# Patient Record
Sex: Female | Born: 1960 | Race: Black or African American | Hispanic: No | Marital: Married | Smoking: Never smoker
Health system: Southern US, Community
[De-identification: ages and names within clinical notes are randomized; demographics above are authoritative.]

## PROBLEM LIST (undated history)

## (undated) DIAGNOSIS — E785 Hyperlipidemia, unspecified: Secondary | ICD-10-CM

## (undated) DIAGNOSIS — M549 Dorsalgia, unspecified: Secondary | ICD-10-CM

## (undated) DIAGNOSIS — R51 Headache: Secondary | ICD-10-CM

## (undated) DIAGNOSIS — J189 Pneumonia, unspecified organism: Secondary | ICD-10-CM

## (undated) DIAGNOSIS — K635 Polyp of colon: Secondary | ICD-10-CM

## (undated) DIAGNOSIS — K649 Unspecified hemorrhoids: Secondary | ICD-10-CM

## (undated) DIAGNOSIS — R0602 Shortness of breath: Secondary | ICD-10-CM

## (undated) DIAGNOSIS — H3530 Unspecified macular degeneration: Secondary | ICD-10-CM

## (undated) DIAGNOSIS — R079 Chest pain, unspecified: Secondary | ICD-10-CM

## (undated) DIAGNOSIS — M7989 Other specified soft tissue disorders: Secondary | ICD-10-CM

## (undated) DIAGNOSIS — R519 Headache, unspecified: Secondary | ICD-10-CM

## (undated) DIAGNOSIS — K6389 Other specified diseases of intestine: Secondary | ICD-10-CM

## (undated) DIAGNOSIS — M2550 Pain in unspecified joint: Secondary | ICD-10-CM

## (undated) DIAGNOSIS — M79672 Pain in left foot: Secondary | ICD-10-CM

## (undated) DIAGNOSIS — K219 Gastro-esophageal reflux disease without esophagitis: Secondary | ICD-10-CM

## (undated) DIAGNOSIS — N951 Menopausal and female climacteric states: Secondary | ICD-10-CM

## (undated) DIAGNOSIS — I10 Essential (primary) hypertension: Secondary | ICD-10-CM

## (undated) DIAGNOSIS — T7840XA Allergy, unspecified, initial encounter: Secondary | ICD-10-CM

## (undated) DIAGNOSIS — K589 Irritable bowel syndrome without diarrhea: Secondary | ICD-10-CM

## (undated) DIAGNOSIS — R768 Other specified abnormal immunological findings in serum: Secondary | ICD-10-CM

## (undated) DIAGNOSIS — D509 Iron deficiency anemia, unspecified: Secondary | ICD-10-CM

## (undated) DIAGNOSIS — G8929 Other chronic pain: Secondary | ICD-10-CM

## (undated) DIAGNOSIS — E669 Obesity, unspecified: Secondary | ICD-10-CM

## (undated) DIAGNOSIS — J45909 Unspecified asthma, uncomplicated: Secondary | ICD-10-CM

## (undated) DIAGNOSIS — R002 Palpitations: Secondary | ICD-10-CM

## (undated) HISTORY — DX: Pneumonia, unspecified organism: J18.9

## (undated) HISTORY — DX: Allergy, unspecified, initial encounter: T78.40XA

## (undated) HISTORY — PX: COLONOSCOPY: SHX174

## (undated) HISTORY — DX: Headache, unspecified: R51.9

## (undated) HISTORY — DX: Other chronic pain: G89.29

## (undated) HISTORY — DX: Chest pain, unspecified: R07.9

## (undated) HISTORY — DX: Headache: R51

## (undated) HISTORY — DX: Unspecified hemorrhoids: K64.9

## (undated) HISTORY — DX: Menopausal and female climacteric states: N95.1

## (undated) HISTORY — DX: Polyp of colon: K63.5

## (undated) HISTORY — DX: Pain in left foot: M79.672

## (undated) HISTORY — DX: Gastro-esophageal reflux disease without esophagitis: K21.9

## (undated) HISTORY — DX: Dorsalgia, unspecified: M54.9

## (undated) HISTORY — DX: Obesity, unspecified: E66.9

## (undated) HISTORY — DX: Irritable bowel syndrome, unspecified: K58.9

## (undated) HISTORY — DX: Shortness of breath: R06.02

## (undated) HISTORY — DX: Hyperlipidemia, unspecified: E78.5

## (undated) HISTORY — DX: Palpitations: R00.2

## (undated) HISTORY — DX: Iron deficiency anemia, unspecified: D50.9

## (undated) HISTORY — DX: Other specified abnormal immunological findings in serum: R76.8

## (undated) HISTORY — DX: Pain in unspecified joint: M25.50

## (undated) HISTORY — DX: Unspecified macular degeneration: H35.30

## (undated) HISTORY — DX: Unspecified asthma, uncomplicated: J45.909

## (undated) HISTORY — DX: Other specified soft tissue disorders: M79.89

---
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
## 1982-11-30 HISTORY — PX: APPENDECTOMY: SHX54

## 1989-11-30 HISTORY — PX: DILATION AND CURETTAGE OF UTERUS: SHX78

## 1989-11-30 HISTORY — PX: OTHER SURGICAL HISTORY: SHX169

## 2003-03-19 ENCOUNTER — Other Ambulatory Visit: Admission: RE | Admit: 2003-03-19 | Discharge: 2003-03-19 | Payer: Self-pay | Admitting: Obstetrics and Gynecology

## 2003-12-01 HISTORY — PX: ABDOMINAL HYSTERECTOMY: SHX81

## 2004-03-24 ENCOUNTER — Other Ambulatory Visit: Admission: RE | Admit: 2004-03-24 | Discharge: 2004-03-24 | Payer: Self-pay | Admitting: Obstetrics and Gynecology

## 2004-05-19 ENCOUNTER — Ambulatory Visit (HOSPITAL_COMMUNITY): Admission: RE | Admit: 2004-05-19 | Discharge: 2004-05-19 | Payer: Self-pay | Admitting: Family Medicine

## 2004-08-13 ENCOUNTER — Observation Stay (HOSPITAL_COMMUNITY): Admission: RE | Admit: 2004-08-13 | Discharge: 2004-08-14 | Payer: Self-pay | Admitting: Obstetrics and Gynecology

## 2004-08-13 ENCOUNTER — Encounter (INDEPENDENT_AMBULATORY_CARE_PROVIDER_SITE_OTHER): Payer: Self-pay | Admitting: *Deleted

## 2005-08-17 ENCOUNTER — Ambulatory Visit (HOSPITAL_COMMUNITY): Admission: RE | Admit: 2005-08-17 | Discharge: 2005-08-17 | Payer: Self-pay | Admitting: Obstetrics and Gynecology

## 2005-12-08 ENCOUNTER — Encounter: Admission: RE | Admit: 2005-12-08 | Discharge: 2005-12-08 | Payer: Self-pay | Admitting: Emergency Medicine

## 2007-03-23 ENCOUNTER — Emergency Department (HOSPITAL_COMMUNITY): Admission: EM | Admit: 2007-03-23 | Discharge: 2007-03-23 | Payer: Self-pay | Admitting: Emergency Medicine

## 2007-08-15 ENCOUNTER — Ambulatory Visit (HOSPITAL_COMMUNITY): Admission: RE | Admit: 2007-08-15 | Discharge: 2007-08-15 | Payer: Self-pay | Admitting: Obstetrics and Gynecology

## 2009-01-02 ENCOUNTER — Ambulatory Visit (HOSPITAL_COMMUNITY): Admission: RE | Admit: 2009-01-02 | Discharge: 2009-01-02 | Payer: Self-pay | Admitting: Obstetrics and Gynecology

## 2010-02-10 ENCOUNTER — Ambulatory Visit (HOSPITAL_COMMUNITY): Admission: RE | Admit: 2010-02-10 | Discharge: 2010-02-10 | Payer: Self-pay | Admitting: Obstetrics and Gynecology

## 2011-01-08 ENCOUNTER — Other Ambulatory Visit (HOSPITAL_COMMUNITY): Payer: Self-pay | Admitting: Obstetrics and Gynecology

## 2011-01-12 ENCOUNTER — Other Ambulatory Visit: Payer: Self-pay | Admitting: Obstetrics and Gynecology

## 2011-01-12 DIAGNOSIS — N644 Mastodynia: Secondary | ICD-10-CM

## 2011-01-15 ENCOUNTER — Ambulatory Visit
Admission: RE | Admit: 2011-01-15 | Discharge: 2011-01-15 | Disposition: A | Discharge status: Home / Self Care | Payer: BC Managed Care – PPO | Source: Ambulatory Visit | Attending: Obstetrics and Gynecology | Admitting: Obstetrics and Gynecology

## 2011-01-15 DIAGNOSIS — N644 Mastodynia: Secondary | ICD-10-CM

## 2011-02-11 ENCOUNTER — Other Ambulatory Visit: Payer: Self-pay | Admitting: Obstetrics and Gynecology

## 2011-02-11 DIAGNOSIS — Z1231 Encounter for screening mammogram for malignant neoplasm of breast: Secondary | ICD-10-CM

## 2011-03-02 ENCOUNTER — Ambulatory Visit
Admission: RE | Admit: 2011-03-02 | Discharge: 2011-03-02 | Disposition: A | Discharge status: Home / Self Care | Payer: BC Managed Care – PPO | Source: Ambulatory Visit | Attending: Obstetrics and Gynecology | Admitting: Obstetrics and Gynecology

## 2011-03-02 DIAGNOSIS — Z1231 Encounter for screening mammogram for malignant neoplasm of breast: Secondary | ICD-10-CM

## 2011-03-04 ENCOUNTER — Other Ambulatory Visit: Payer: Self-pay | Admitting: Obstetrics and Gynecology

## 2011-03-04 DIAGNOSIS — R928 Other abnormal and inconclusive findings on diagnostic imaging of breast: Secondary | ICD-10-CM

## 2011-03-09 ENCOUNTER — Ambulatory Visit
Admission: RE | Admit: 2011-03-09 | Discharge: 2011-03-09 | Disposition: A | Discharge status: Home / Self Care | Payer: BC Managed Care – PPO | Source: Ambulatory Visit | Attending: Obstetrics and Gynecology | Admitting: Obstetrics and Gynecology

## 2011-03-09 DIAGNOSIS — R928 Other abnormal and inconclusive findings on diagnostic imaging of breast: Secondary | ICD-10-CM

## 2011-04-17 NOTE — Op Note (Signed)
NAMEMIKEISHA, LEMONDS                           ACCOUNT NO.:  [id number]   MEDICAL RECORD NO.:  [id number]                   PATIENT TYPE:  INP   LOCATION:  9303                                 FACILITY:  [facility]   PHYSICIAN:  Maxie Better, M.D.            DATE OF BIRTH:  16-Oct-1961   DATE OF PROCEDURE:  08/13/2004  DATE OF DISCHARGE:                                 OPERATIVE REPORT   PREOPERATIVE DIAGNOSES:  1.  Menorrhagia.  2.  Uterine fibroids.   POSTOPERATIVE DIAGNOSE:  1.  Menorrhagia.  2.  Uterine fibroids.   PROCEDURE:  Laparoscopically-assisted vaginal hysterectomy.   SURGEON:  Maxie Better, M.D.   ASSISTANT:  Cordelia Pen A. Dickstein, M.D.   DESCRIPTION OF PROCEDURE:  Under adequate general anesthesia, the patient  was placed in the dorsal lithotomy position.  Examination under anesthesia  revealed a 12+ week size irregular uterus.  The patient was sterilely  prepped and draped in the usual fashion for a laparoscopic assisted vaginal  hysterectomy.  An indwelling Foley catheter was sterilely placed.  Bivalve  speculum was placed in the vagina.  A single-tooth tenaculum was placed in  the anterior lip of the cervix.  An acorn cannula was introduced into the  cervical os and attached to the tenaculum for manipulation of the uterus.  The bivalve speculum was then removed.  Attention was then turned to the  abdomen where 0.25% Marcaine was injected infraumbilically.  An  infraumbilical incision was then made.  The Veress needle was introduced  into the abdomen.  Opening pressure was 6 liters of carbon dioxide.  It was  insufflated and normal saline was used to test the placement of the Veress  needle.  The Veress needle was then removed.  A disposable 10 mm trocar was  introduced into the abdomen without incident.  A lighted video laparoscope  was introduced through that port.  A panoramic view was initially done.  The  uterus was noted to be large.  Perihepatic  adhesions were noted.  The liver  was otherwise normal.  The surgical abscess of the appendix was noted.  Additional ports were placed in the lower quadrants, right and left, after  illuminating the abdominal wall and a small incision was then made.  The 5  mm ports were then placed in the lower right lateral quadrants.  A probe was  then utilized to inspect the pelvis for capabilities of doing this type of  procedure in light of the enlarged uterus.  There was a subserosal posterior  fibroid noted in the cul-de-sac which had no adhesions.  The surgical  separation of both fallopian tubes was noted.  Both ovaries were noted to be  normal.  The utero-ovarian ligaments were shortened bilaterally.  There was  no evidence of endometriosis.  Using the tripolar, the left round ligament  and left utero-ovarian ligaments were then cauterized and multiple times cut  until  the level of the full reflection of the bladder was reached.  The  Nezhat was then utilized to insufflate with water. the peritoneum of the  bladder and the bladder was then opened transversely.  The right utero-  ovarian ligament was then again also cauterized, cut until the bladder  reflection was also reached.  The large size of the uterus limited the  ability to mobilize as well but taking down the right side was sequentially  performed.  The bladder reflection was then again insufflated with water and  then opened transversely completely.  The bladder was then pushed downward  using the scissors.  The ureters were then subsequently identified  bilaterally and peristalsing well.  Cauterization of any blood vessels was  then performed after the upper abdomen was thought to be sufficiently  dissected.  The ports were left in place.  The instruments in the ports were  removed.  The abdomen was deflated.  Attention was then turned to the  vagina.  A weighted speculum was then placed.  The cervix was parous, large  but high in the  vault.  Sims retractor was then utilized and the cervix was  subsequently grasped with Christella Hartigan tenaculums anteriorly and posteriorly.  Circumferentially, dilute solution was injected at the cervical vaginal  junction.  A circumferential incision was made at that junction which led  the posterior cul-de-sac to be opened and the uterosacral ligaments were  then bilaterally clamped, cut and suture ligated with 0 Vicryl suture.  Bleeding from the posterior cuff was noted and the cuff was then isolated  posteriorly and reached with 0 Vicryl running locked stitch.  The vaginal  mucosa was then continued to be undermined from the cervical attachment  circumferentially and _________ anteriorly.  The lower portion of the  cardinal ligaments was then again clamped, cut and suture ligated with 0  Vicryl suture.  The anterior cul-de-sac was then subsequently opened after  dissection.  The decision was then made to do the uterine vessels.  On the  left, the uterine vessels were noted and cauterized using the LigaSure and  subsequently cut.  Despite cauterization, backbleeding was noted to be  prominent from the portion on the cervix.  A decision was then made to  utilize the Haney clamp.  I clamped the left uterine vessels, cut and suture  ligated it with 0 Vicryl suture.  Additional back bleeders were then clamped  with the straight Haney clamp.  Attention was then turned to the  contralateral side where again using the LigaSure serial clamp and  cauterization was then performed which was reached the right uterine vessels  which were then cauterized, cut.  On the left side, the additional  attachment.  All attachments on the left were then accomplished.  The  decision was then made to bivalve the uterus and this was done in the  midline of the cervix and fibroids were removed sequentially.  A portion of the uterus was then morcellated to facilitate the debulking and bringing  down of the uterus.   Subsequently, the uterus was delivered after several  fibroids and portions of it had been removed.  The remaining attachment on  the right was then cauterized with the LigaSure and cut.  The uterine  vessels on the right were then subsequently additionally clamped, and suture  ligated with 0 Vicryl suture.  The right uterosacral ligament had some  bleeding.  This was reclamped and resuture ligated with Haney suturing of 0  Vicryl.  Bleeding on the posterior reefed cuff was then additionally  hemostased with 0 Vicryl figure-of-eight sutures.  Hemostasis then was  subsequently noted.  The anterior peritoneum was identified and the vaginal  cuff was then closed vertically with interrupted 0 Vicryl figure-of-eight  sutures.  The uterosacral ligaments were then tied in the midline and the  vaginal cuff completely closed with 0 Vicryl interrupted sutures.  Attention  was then turned to the abdomen where the abdomen was reinsufflated.  The  lighted video laparoscope was then introduced.  The pelvis was inspected.  Small bleeders were cauterized.  The cavity was then suctioned.  The abdomen  was partially deflated and reinspection of the pelvis showed good hemostasis  at which point the lower portion was then removed under direct  visualization.  Good hemostasis was noted at the port sites.  The  infraumbilical site was then removed under direct visualization.  The  abdomen was further deflated.  The fascia was then identified in the  infraumbilical area and closed with 0 Vicryl figure-of-eight sutures and the  skin incisions all were approximated with 4-0 Vicryl subcuticular stitch.  Specimen was the morcellated uterus and cervix and fibroids sent to  pathology  Estimated blood loss is about 700 mL.  Intraoperative fluid was  2200 mL of crystalloid . Urine output was 500 mL of clear yellow urine.  Sponge and instrument counts x2 were correct.  Complications were none.  The  patient tolerated the  procedure well and was transferred to recovery in  stable condition.                                               Maxie Better, M.D.    [place]/MEDQ  D:  08/13/2004  T:  08/13/2004  Job:  [id number]

## 2011-04-17 NOTE — H&P (Signed)
[name], [name]                           ACCOUNT NO.:  [id number]   MEDICAL RECORD NO.:  [id number]                   PATIENT TYPE:  INP   LOCATION:  NA                                   [facility]ACILITY:  [facility]   PHYSICIAN:  Maxie Better, M.D.            DATE OF BIRTH:  26-Oct-1961   DATE OF ADMISSION:  08/13/2004  DATE OF DISCHARGE:                                HISTORY & PHYSICAL   CHIEF COMPLAINT:  Fibroid uterus, menorrhagia.   HISTORY OF PRESENT ILLNESS:  [age]-year-old gravida 4, para 2-0-2-2,  married black female, history of tubal ligation, currently on Lupron Depot  therapy, now being admitted for laparoscopic assisted vaginal hysterectomy  secondary to symptomatic uterine fibroids.  The patient has had heavy  menses; her cycles are 28 days with the first 2 days the heaviest flow.  She  has also had pain with intercourse and pain in her back as a result of the  fibroids.  The patient complained of urinary frequency and inability to  empty her bladder on occasions.  She was seen by the urologist and  evaluation did not reveal any stress urinary incontinence and medications  which she did not take.  Her evaluation for her gynecological complaints  included an ultrasound done on Apr 21, 2004 that showed multiple uterine  fibroids, uterus measuring 11.5 x 7.7 and 8.7; a submucosal fibroid could  not be ruled out at the time.  Both ovaries were seen and normal.  The  patient also had an endometrial biopsy done on May 05, 2004 that showed  benign secretory endometrium.  Her CBC on Apr 23, 2003 showed a hemoglobin  of 10.1, hematocrit of 31.5; the patient was placed on iron supplementation  and elected to do a Lupron Depot therapy to facilitate shrinkage of her  uterine size and increase her hemoglobin.  The patient declined any other  treatment options for her fibroids.   ALLERGIES:  Allergies to AMOXICILLIN.   MEDICATIONS:  Atenolol, __________, iron supplementation.   PAST MEDICAL HISTORY:  1.  Chronic hypertension.  2.  Iron deficiency anemia.  3.  Exogenous obesity.   SURGICAL HISTORY:  1.  Appendectomy.  2.  Bilateral tubal ligation.  3.  D&C.  4.  Cryosurgery.   OBSTETRICAL HISTORY:  D&E for a TAB and SAB, 2 vaginal deliveries.   FAMILY HISTORY:  Mother -- hypertension and diabetes.  No genital, ovarian  or colon cancer.   SOCIAL HISTORY:  Married, nonsmoker, 2 children, self-employed.   REVIEW OF SYSTEMS:  Review of systems as per history of present illness,  otherwise, negative.   PHYSICAL EXAM:  GENERAL:  Well-developed, well-nourished black female in no  acute distress.  VITAL SIGNS:  Blood pressure 128/86.  Weight 237 pounds, height 5 feet 5-3/4  inches.  SKIN:  Skin shows no lesions.  HEENT:  Anicteric sclerae.  Pink conjunctivae.  Oropharynx negative.  Prominent tonsils.  NECK:  Neck was supple.  Thyroid palpable.  NODES:  No supraclavicular, axillary or inguinal nodes palpable.  LUNGS:  Lungs were clear to auscultation.  HEART:  Heart was regular rate and rhythm without murmur.  BREASTS:  Breasts were soft, nontender, pedunculated.  No palpable mass.  Upward nipples without discharge.  BACK:  No CVA tenderness.  ABDOMEN:  Abdomen obese, soft with infraumbilical scar and a right lower  quadrant scar.  PELVIC:  Vulva shows no lesions.  Vagina had some white discharge.  Cervix  was parous.  Uterus was about 10 weeks' size.  No adnexal masses could be  appreciated.  RECTAL:  Exam deferred.  EXTREMITIES:  No edema.   IMPRESSION:  1.  Menorrhagia.  2.  Fibroid uterus.  3.  Chronic hypertension.   PLAN:  Admission, routine admission labs, laparoscopic assisted vaginal  hysterectomy with preservation of ovarian function, antibiotic prophylaxis,  antiembolic stockings.  Risks of the procedure were reviewed with the  patient including, but not limited to, infection, bleeding, inability to  complete the procedure vaginally,  injury to surrounding organ structures  such as the bladder, bowel or ureter, thermal injury, fistula formation,  possible need for surgery in the future secondary to ovarian cyst or ovarian  cancer/ovarian disease, bowel obstruction, internal scar tissue which can  lead to pelvic pain in the future.  Risks of blood transfusion were  discussed including HIV transmission, hepatitis transmission and acute  reaction to the blood products.  Postop care and criteria for discharge were  reviewed, continuation of antihypertensive medication postoperatively is  planned.  All questions answered.                                               Maxie Better, M.D.    [place]/MEDQ  D:  08/12/2004  T:  08/12/2004  Job:  [id number]

## 2011-12-06 ENCOUNTER — Emergency Department (HOSPITAL_COMMUNITY)
Admission: EM | Admit: 2011-12-06 | Discharge: 2011-12-06 | Disposition: A | Discharge status: Home / Self Care | Payer: BC Managed Care – PPO | Attending: Emergency Medicine | Admitting: Emergency Medicine

## 2011-12-06 ENCOUNTER — Encounter (HOSPITAL_COMMUNITY): Payer: Self-pay | Admitting: Nurse Practitioner

## 2011-12-06 DIAGNOSIS — I10 Essential (primary) hypertension: Secondary | ICD-10-CM | POA: Insufficient documentation

## 2011-12-06 DIAGNOSIS — L509 Urticaria, unspecified: Secondary | ICD-10-CM | POA: Insufficient documentation

## 2011-12-06 DIAGNOSIS — L298 Other pruritus: Secondary | ICD-10-CM | POA: Insufficient documentation

## 2011-12-06 DIAGNOSIS — L2989 Other pruritus: Secondary | ICD-10-CM | POA: Insufficient documentation

## 2011-12-06 DIAGNOSIS — Z79899 Other long term (current) drug therapy: Secondary | ICD-10-CM | POA: Insufficient documentation

## 2011-12-06 DIAGNOSIS — J351 Hypertrophy of tonsils: Secondary | ICD-10-CM | POA: Insufficient documentation

## 2011-12-06 DIAGNOSIS — R609 Edema, unspecified: Secondary | ICD-10-CM | POA: Insufficient documentation

## 2011-12-06 HISTORY — DX: Essential (primary) hypertension: I10

## 2011-12-06 MED ORDER — PREDNISONE 20 MG PO TABS: 10.0000 mg | ORAL_TABLET | Freq: Once | ORAL | Status: DC

## 2011-12-06 MED ORDER — PREDNISONE 20 MG PO TABS
60.0000 mg | ORAL_TABLET | Freq: Once | ORAL | Status: AC
  Administered 2011-12-06: 60 mg via ORAL
  Filled 2011-12-06: qty 3

## 2011-12-06 MED ORDER — FAMOTIDINE 20 MG PO TABS
20.0000 mg | ORAL_TABLET | Freq: Once | ORAL | Status: AC
  Administered 2011-12-06: 20 mg via ORAL
  Filled 2011-12-06: qty 1

## 2011-12-06 MED ORDER — FAMOTIDINE 20 MG PO TABS: 20.0000 mg | ORAL_TABLET | Freq: Two times a day (BID) | ORAL | Status: DC

## 2011-12-06 MED ORDER — LORATADINE 10 MG PO TABS: 10.0000 mg | ORAL_TABLET | Freq: Every day | ORAL | Status: DC

## 2011-12-06 MED ORDER — PREDNISONE 50 MG PO TABS: 50.0000 mg | ORAL_TABLET | Freq: Every day | ORAL | Status: DC

## 2011-12-06 MED ORDER — DIPHENHYDRAMINE HCL 25 MG PO CAPS
25.0000 mg | ORAL_CAPSULE | Freq: Once | ORAL | Status: AC
  Administered 2011-12-06: 25 mg via ORAL
  Filled 2011-12-06: qty 1

## 2011-12-06 NOTE — ED Provider Notes (Signed)
History     CSN: [id number]  Arrival date & time 12/06/11  4010   First MD Initiated Contact with Patient 12/06/11 1014      Chief Complaint  Patient presents with  . Urticaria    (Consider location/radiation/quality/duration/timing/severity/associated sxs/prior treatment) Patient is a 51 y.o. female presenting with urticaria. The history is provided by the patient.  Urticaria  She started having hives 3 days ago. Hives they're generalized and itchy. She is not aware of anything that she may have come in contact to precipitate the hives, but the Benadryl does give her temporary relief from the hives. She denies any difficulty breathing or swallowing. She has had hives before. One episode of hives was related to fish oil capsules, but other episodes were not clearly identified with any precipitating cause. Symptoms are moderate.  Past Medical History  Diagnosis Date  . Hypertension     History reviewed. No pertinent past surgical history.  History reviewed. No pertinent family history.  History  Substance Use Topics  . Smoking status: Never Smoker   . Smokeless tobacco: Not on file  . Alcohol Use: No    OB History    Grav Para Term Preterm Abortions TAB SAB Ect Mult Living                  Review of Systems  All other systems reviewed and are negative.    Allergies  Amoxicillin and Shellfish allergy  Home Medications   Current Outpatient Rx  Name Route Sig Dispense Refill  . ATENOLOL 50 MG PO TABS Oral Take 50 mg by mouth daily.      Marland Kitchen DIPHENHYDRAMINE HCL 25 MG PO TABS Oral Take 25 mg by mouth every 6 (six) hours as needed. For itching       BP 135/69  Pulse 62  Temp(Src) 98 F (36.7 C) (Oral)  Resp 18  SpO2 99%  Physical Exam  Nursing note and vitals reviewed.  51 year old female is resting comfortably and in no acute distress. Vital signs are normal. Oxygen saturation is 99% which is normal. Head is normocephalic and atraumatic. PERRLA, EOMI.  Oropharynx is mild to moderate erythema with slight tonsillar hypertrophy. No exudate is seen. She has no difficulty with her secretions and phonation is normal. Neck is supple without adenopathy. Lungs are clear without rales, wheezes, rhonchi. Back is nontender. Heart has regular rate rhythm without murmur. There is no chest wall tenderness. Abdomen is soft, flat, nontender without masses or hepatosplenomegaly. Extremities have no cyanosis. There is a 1+ edema. Range of motion is normal. Skin has diffuse urticarial lesions which are more prominent on her legs than the trunk or arms. Some lesions are present on her face as well. Neurologic: Mental status is normal, cranial nerves are intact, there are no focal motor or sensory deficits. Psychiatric: No abnormalities of mood or affect.  ED Course  Procedures (including critical care time)   Labs Reviewed  RAPID STREP SCREEN   No results found.  Results for orders placed during the hospital encounter of 12/06/11  RAPID STREP SCREEN      Component Value Range   Streptococcus, Group A Screen (Direct) NEGATIVE  NEGATIVE    No results found.   No diagnosis found.  She is given initial doses of Pepcid, prednisone, and Benadryl.  Impression: Urticaria. She will be sent home with prescription for prednisone 50 mg for 5 days and instructed to take either Claritin or Zyrtec once a day and either Pepcid  or Zantac twice a day.  MDM  Urticaria which is idiopathic. Because of the throat does appear to be inflamed, beta strep screen will be obtained to be sure this is not a strep associated rash.        Dione Booze, MD 12/06/11 1114

## 2011-12-06 NOTE — ED Notes (Signed)
Pt reports hives to extremities and face onset Thursday, taking benadryl with no relief. Pt denies SOB or pain. States they are itchy. No new products or meds.

## 2012-04-11 ENCOUNTER — Other Ambulatory Visit (HOSPITAL_COMMUNITY): Payer: Self-pay | Admitting: Obstetrics and Gynecology

## 2012-04-11 DIAGNOSIS — Z1231 Encounter for screening mammogram for malignant neoplasm of breast: Secondary | ICD-10-CM

## 2012-05-09 ENCOUNTER — Ambulatory Visit (HOSPITAL_COMMUNITY)
Admission: RE | Admit: 2012-05-09 | Discharge: 2012-05-09 | Disposition: A | Discharge status: Home / Self Care | Payer: BC Managed Care – PPO | Source: Ambulatory Visit | Attending: Obstetrics and Gynecology | Admitting: Obstetrics and Gynecology

## 2012-05-09 DIAGNOSIS — Z1231 Encounter for screening mammogram for malignant neoplasm of breast: Secondary | ICD-10-CM | POA: Insufficient documentation

## 2013-09-25 ENCOUNTER — Other Ambulatory Visit (HOSPITAL_COMMUNITY): Payer: Self-pay | Admitting: Obstetrics and Gynecology

## 2013-09-25 DIAGNOSIS — Z1231 Encounter for screening mammogram for malignant neoplasm of breast: Secondary | ICD-10-CM

## 2013-10-09 ENCOUNTER — Ambulatory Visit (HOSPITAL_COMMUNITY): Payer: BC Managed Care – PPO

## 2013-10-09 ENCOUNTER — Ambulatory Visit (HOSPITAL_COMMUNITY)
Admission: RE | Admit: 2013-10-09 | Discharge: 2013-10-09 | Disposition: A | Discharge status: Home / Self Care | Payer: BC Managed Care – PPO | Source: Ambulatory Visit | Attending: Obstetrics and Gynecology | Admitting: Obstetrics and Gynecology

## 2013-10-09 DIAGNOSIS — Z1231 Encounter for screening mammogram for malignant neoplasm of breast: Secondary | ICD-10-CM

## 2014-01-24 ENCOUNTER — Emergency Department (HOSPITAL_COMMUNITY)
Admission: EM | Admit: 2014-01-24 | Discharge: 2014-01-24 | Disposition: A | Discharge status: Home / Self Care | Payer: BC Managed Care – PPO | Attending: Emergency Medicine | Admitting: Emergency Medicine

## 2014-01-24 ENCOUNTER — Encounter (HOSPITAL_COMMUNITY): Payer: Self-pay | Admitting: Emergency Medicine

## 2014-01-24 DIAGNOSIS — IMO0002 Reserved for concepts with insufficient information to code with codable children: Secondary | ICD-10-CM | POA: Insufficient documentation

## 2014-01-24 DIAGNOSIS — Z79899 Other long term (current) drug therapy: Secondary | ICD-10-CM | POA: Insufficient documentation

## 2014-01-24 DIAGNOSIS — L508 Other urticaria: Secondary | ICD-10-CM | POA: Insufficient documentation

## 2014-01-24 DIAGNOSIS — L509 Urticaria, unspecified: Secondary | ICD-10-CM

## 2014-01-24 DIAGNOSIS — I10 Essential (primary) hypertension: Secondary | ICD-10-CM | POA: Insufficient documentation

## 2014-01-24 MED ORDER — DIPHENHYDRAMINE HCL 25 MG PO CAPS
25.0000 mg | ORAL_CAPSULE | Freq: Once | ORAL | Status: AC
  Administered 2014-01-24: 25 mg via ORAL
  Filled 2014-01-24: qty 1

## 2014-01-24 MED ORDER — RANITIDINE HCL 150 MG/10ML PO SYRP
150.0000 mg | ORAL_SOLUTION | Freq: Once | ORAL | Status: AC
  Administered 2014-01-24: 150 mg via ORAL
  Filled 2014-01-24: qty 10

## 2014-01-24 MED ORDER — PREDNISONE 20 MG PO TABS
60.0000 mg | ORAL_TABLET | Freq: Once | ORAL | Status: AC
  Administered 2014-01-24: 60 mg via ORAL
  Filled 2014-01-24: qty 3

## 2014-01-24 MED ORDER — PREDNISONE 20 MG PO TABS: 60.0000 mg | ORAL_TABLET | Freq: Every day | ORAL | Status: DC

## 2014-01-24 NOTE — ED Notes (Signed)
Patient reports that she took claritin today.

## 2014-01-24 NOTE — ED Notes (Signed)
Patient states she if feeling full in the back of her throat

## 2014-01-24 NOTE — ED Notes (Signed)
Patient started taking liquid Nutriburst and read the ingredients and found out there was kelp in it.  Patient has allergy to shellfish

## 2014-01-24 NOTE — Discharge Instructions (Signed)
Allergies  Allergies may happen from anything your body is sensitive to. This may be food, medicines, pollens, chemicals, and many other things. Food allergies can be severe and deadly.  HOME CARE  If you do not know what causes a reaction, keep a diary. Write down the foods you ate and the symptoms that followed. Avoid foods that cause reactions.  If you have red raised spots (hives) or a rash:  Take medicine as told by your doctor.  Use medicines for red raised spots and itching as needed.  Apply cold cloths (compresses) to the skin. Take a cool bath. Avoid hot baths or showers.  If you are severely allergic:  It is often necessary to go to the hospital after you have treated your reaction.  Wear your medical alert jewelry.  You and your family must learn how to give a allergy shot or use an allergy kit (anaphylaxis kit).  Always carry your allergy kit or shot with you. Use this medicine as told by your doctor if a severe reaction is occurring. GET HELP RIGHT AWAY IF:  You have trouble breathing or are making high-pitched whistling sounds (wheezing).  You have a tight feeling in your chest or throat.  You have a puffy (swollen) mouth.  You have red raised spots, puffiness (swelling), or itching all over your body.  You have had a severe reaction that was helped by your allergy kit or shot. The reaction can return once the medicine has worn off.  You think you are having a food allergy. Symptoms most often happen within 30 minutes of eating a food.  Your symptoms have not gone away within 2 days or are getting worse.  You have new symptoms.  You want to retest yourself with a food or drink you think causes an allergic reaction. Only do this under the care of a doctor. MAKE SURE YOU:   Understand these instructions.  Will watch your condition.  Will get help right away if you are not doing well or get worse. Document Released: 03/13/2013 Document Reviewed:  03/13/2013 [facility] Patient Information 2014 [place], [place].  Exercise-Induced Urticaria Uticaria is the medical term for the allergic reaction known commonly as hives that causes itchy and irritated skin. For certain individuals, urticaria may be caused by physical activity. Exercise-induced urticaria is more common in younger athletes. Urticaria typically begin as just an itchy skin rash; however, exercise induced urticaria can progress to anaphylaxis. Anaphylaxis is marked by an increase in the hives, as well as airway constriction, a decrease in blood pressure, rapid pulse, or dizziness. Anaphylaxis is a serious medical condition and if left untreated can result in death. SYMPTOMS   Hives during or after exercise.  Flushing of the skin.  Itch.  Headache.  Nausea.  Swelling of the throat and upper airway.  Difficulty breathing.  Dizziness. CAUSES  For certain individuals, exercise may cause the body to elicit an immune response. Antibodies cross-link on the surface of mast cells. The mast cells release substances that create the symptoms of an allergic reaction, such as hives, itching and the other symptoms that accompany this syndrome. The antigen release in this condition can be caused by:   Exercise.  Gradual heating of the body.  Water loss of the body. RISK INCREASES WITH:   Weather extremes (hot or cold).  Drug reactions.  Food stimuli.  Increased emotional response.  Menses. PREVENTION   Before symptoms occur (prophylactic) antihistamine use.  Avoid taking medications you are sensitive to  prior to exercising.  Avoid eating shellfish or other foods you are sensitive to prior to exercising.  Avoid exercising in extreme temperatures, either cold or hot. TREATMENT  As soon as symptoms appear, stop exercising, cool off, and seek medical attention for an allergic reaction. Individuals who have been diagnosed with exercise-induced urticaria or anaphylaxis may be  prescribed injectable epinephrine to treat symptoms. If you have been prescribed medication, it is important that you know how to administer the injections. Symptoms can become severe quickly so prepare to seek emergency medical attention. It is advisable to wear a medical alert bracelet in case of collapse. It is also a good idea to exercise with a partner who is aware of your condition. If symptoms develop, cease exercise and find a cool place to rest. As mentioned previously, prophylactic antihistamines may not be beneficial in all cases.  Document Released: 11/16/2005 Document Revised: 02/08/2012 Document Reviewed: 02/28/2009 [facility] Patient Information 2014 [place], [place].

## 2014-01-24 NOTE — ED Provider Notes (Signed)
CSN: [id number]     Arrival date & time 01/24/14  2151 History   First MD Initiated Contact with Patient 01/24/14 2218     Chief Complaint  Patient presents with  . Allergic Reaction    Patient is a 53 y.o. female presenting with rash.  Rash Location:  Shoulder/arm Shoulder/arm rash location:  L arm and R arm Quality: itchiness and redness   Severity:  Mild Onset quality:  Gradual Duration:  3 days Timing:  Intermittent Progression:  Improving Chronicity:  New Context: food ( multiviotamin with kelp in it)   Relieved by:  None tried Worsened by:  Nothing tried Ineffective treatments:  None tried Associated symptoms: no abdominal pain, no diarrhea, no fatigue, no fever, no headaches, no hoarse voice, no induration, no joint pain, no myalgias, no nausea, no periorbital edema, no shortness of breath, no sore throat, no throat swelling, no tongue swelling, no URI, not vomiting and not wheezing       Past Medical History  Diagnosis Date  . Hypertension    History reviewed. No pertinent past surgical history. History reviewed. No pertinent family history. History  Substance Use Topics  . Smoking status: Never Smoker   . Smokeless tobacco: Not on file  . Alcohol Use: No   OB History   Grav Para Term Preterm Abortions TAB SAB Ect Mult Living                 Review of Systems  Constitutional: Negative for fever, chills, activity change, appetite change and fatigue.  HENT: Negative for congestion, ear pain, hoarse voice, rhinorrhea and sore throat.   Eyes: Negative for pain.  Respiratory: Negative for cough, shortness of breath and wheezing.   Cardiovascular: Negative for chest pain and palpitations.  Gastrointestinal: Negative for nausea, vomiting, abdominal pain and diarrhea.  Genitourinary: Negative for dysuria, difficulty urinating and pelvic pain.  Musculoskeletal: Negative for arthralgias, back pain, myalgias and neck pain.  Skin: Positive for rash ( ). Negative for  wound.  Neurological: Negative for weakness and headaches.  Psychiatric/Behavioral: Negative for behavioral problems, confusion and agitation.      Allergies  Amoxicillin and Shellfish allergy  Home Medications   Current Outpatient Rx  Name  Route  Sig  Dispense  Refill  . albuterol (PROVENTIL HFA;VENTOLIN HFA) 108 (90 BASE) MCG/ACT inhaler   Inhalation   Inhale 1-2 puffs into the lungs every 6 (six) hours as needed for wheezing or shortness of breath.         Marland Kitchen. atenolol (TENORMIN) 50 MG tablet   Oral   Take 50 mg by mouth daily.           Marland Kitchen. loratadine (CLARITIN) 10 MG tablet   Oral   Take 10 mg by mouth daily as needed for allergies or itching.         . predniSONE (DELTASONE) 20 MG tablet   Oral   Take 3 tablets (60 mg total) by mouth daily with breakfast.   15 tablet   0    BP 138/52  Pulse 68  Temp(Src) 98 F (36.7 C) (Oral)  Resp 16  Ht 5\' 6"  (1.676 m)  Wt 222 lb (100.699 kg)  BMI 35.85 kg/m2  SpO2 100% Physical Exam  Constitutional: She is oriented to person, place, and time. She appears well-developed and well-nourished. No distress.  HENT:  Head: Normocephalic and atraumatic.  Nose: Nose normal.  Mouth/Throat: Oropharynx is clear and moist.  Eyes: EOM are normal. Pupils are  equal, round, and reactive to light.  Neck: Normal range of motion. Neck supple. No tracheal deviation present.  Cardiovascular: Normal rate, regular rhythm, normal heart sounds and intact distal pulses.   Pulmonary/Chest: Effort normal and breath sounds normal. She has no rales.  Abdominal: Soft. Bowel sounds are normal. She exhibits no distension. There is no tenderness. There is no rebound and no guarding.  Musculoskeletal: Normal range of motion. She exhibits no tenderness.  Neurological: She is alert and oriented to person, place, and time.  Skin: Skin is warm and dry. Rash ( urticarial) noted.  Psychiatric: She has a normal mood and affect. Her behavior is normal.     ED Course  Procedures (including critical care time)  MDM   Final diagnoses:  Urticarial rash    53 yo F in NAD AFVSS non toxic appearing. Presents with urticarial rash on arms. Patient believes it is 2/2 new vitamin with kelp in it. She has no stridor, wheeze , tongue or uvula  Swelling. She discontinued the vitamin on Monday and has mild improvement in her sxs. Patient also has considered exercise as possible cause as the rash developed after exercising on Monday and the rash has worsened every time she exercised this week. Patient appears well here. Will give prednisone/benadryl/ranitidine. Doubt anaphylaxis. Case co managed with my attending Dr. Juleen China. Thorough discussions with patient on plan, findings, return precautions.     Nadara Mustard, MD 01/24/14 2300

## 2014-01-25 NOTE — ED Provider Notes (Signed)
I saw and evaluated the patient, reviewed the resident's note and I agree with the findings and plan.  EKG Interpretation   None       Pt with urticarial rash. Possibly urticarial as has noted consistently after working out. No distress on exam. Speaking in complete sentences. Oropharynx clear. No increased wob. Lungs clear. HD stable. Symptomatic tx. Return precautions discussed.   Raeford RazorStephen Josephyne Tarter, MD 01/25/14 [phone]

## 2014-03-03 ENCOUNTER — Encounter: Payer: Self-pay | Admitting: *Deleted

## 2014-03-03 DIAGNOSIS — I10 Essential (primary) hypertension: Secondary | ICD-10-CM

## 2014-05-18 ENCOUNTER — Other Ambulatory Visit: Payer: Self-pay | Admitting: Obstetrics and Gynecology

## 2014-05-18 DIAGNOSIS — N644 Mastodynia: Secondary | ICD-10-CM

## 2014-05-18 DIAGNOSIS — N63 Unspecified lump in unspecified breast: Secondary | ICD-10-CM

## 2014-05-28 ENCOUNTER — Ambulatory Visit
Admission: RE | Admit: 2014-05-28 | Discharge: 2014-05-28 | Disposition: A | Discharge status: Home / Self Care | Payer: BC Managed Care – PPO | Source: Ambulatory Visit | Attending: Obstetrics and Gynecology | Admitting: Obstetrics and Gynecology

## 2014-05-28 ENCOUNTER — Encounter (INDEPENDENT_AMBULATORY_CARE_PROVIDER_SITE_OTHER): Payer: Self-pay

## 2014-05-28 DIAGNOSIS — N644 Mastodynia: Secondary | ICD-10-CM

## 2014-05-28 DIAGNOSIS — N63 Unspecified lump in unspecified breast: Secondary | ICD-10-CM

## 2016-02-24 ENCOUNTER — Other Ambulatory Visit: Payer: Self-pay

## 2016-02-24 DIAGNOSIS — Z1231 Encounter for screening mammogram for malignant neoplasm of breast: Secondary | ICD-10-CM

## 2016-03-04 ENCOUNTER — Ambulatory Visit
Admission: RE | Admit: 2016-03-04 | Discharge: 2016-03-04 | Disposition: A | Discharge status: Home / Self Care | Payer: BLUE CROSS/BLUE SHIELD | Source: Ambulatory Visit

## 2016-03-04 DIAGNOSIS — Z1231 Encounter for screening mammogram for malignant neoplasm of breast: Secondary | ICD-10-CM

## 2016-04-20 DIAGNOSIS — R21 Rash and other nonspecific skin eruption: Secondary | ICD-10-CM | POA: Diagnosis not present

## 2016-04-30 DIAGNOSIS — R1011 Right upper quadrant pain: Secondary | ICD-10-CM | POA: Diagnosis not present

## 2016-04-30 DIAGNOSIS — K219 Gastro-esophageal reflux disease without esophagitis: Secondary | ICD-10-CM | POA: Diagnosis not present

## 2016-05-01 ENCOUNTER — Other Ambulatory Visit: Payer: Self-pay | Admitting: Family Medicine

## 2016-05-01 DIAGNOSIS — K8020 Calculus of gallbladder without cholecystitis without obstruction: Secondary | ICD-10-CM

## 2016-05-11 ENCOUNTER — Ambulatory Visit
Admission: RE | Admit: 2016-05-11 | Discharge: 2016-05-11 | Disposition: A | Discharge status: Home / Self Care | Payer: BLUE CROSS/BLUE SHIELD | Source: Ambulatory Visit | Attending: Family Medicine | Admitting: Family Medicine

## 2016-05-11 DIAGNOSIS — K8020 Calculus of gallbladder without cholecystitis without obstruction: Secondary | ICD-10-CM

## 2016-05-11 DIAGNOSIS — R1011 Right upper quadrant pain: Secondary | ICD-10-CM | POA: Diagnosis not present

## 2016-07-20 DIAGNOSIS — Z0000 Encounter for general adult medical examination without abnormal findings: Secondary | ICD-10-CM | POA: Diagnosis not present

## 2016-07-20 DIAGNOSIS — K219 Gastro-esophageal reflux disease without esophagitis: Secondary | ICD-10-CM | POA: Diagnosis not present

## 2016-07-20 DIAGNOSIS — I10 Essential (primary) hypertension: Secondary | ICD-10-CM | POA: Diagnosis not present

## 2016-09-09 DIAGNOSIS — J069 Acute upper respiratory infection, unspecified: Secondary | ICD-10-CM | POA: Diagnosis not present

## 2016-09-18 ENCOUNTER — Emergency Department (HOSPITAL_COMMUNITY): Payer: BLUE CROSS/BLUE SHIELD

## 2016-09-18 ENCOUNTER — Emergency Department (HOSPITAL_COMMUNITY)
Admission: EM | Admit: 2016-09-18 | Discharge: 2016-09-19 | Disposition: A | Discharge status: Home / Self Care | Payer: BLUE CROSS/BLUE SHIELD | Attending: Emergency Medicine | Admitting: Emergency Medicine

## 2016-09-18 ENCOUNTER — Encounter (HOSPITAL_COMMUNITY): Payer: Self-pay

## 2016-09-18 DIAGNOSIS — R0602 Shortness of breath: Secondary | ICD-10-CM | POA: Diagnosis not present

## 2016-09-18 DIAGNOSIS — J189 Pneumonia, unspecified organism: Secondary | ICD-10-CM

## 2016-09-18 DIAGNOSIS — Z79899 Other long term (current) drug therapy: Secondary | ICD-10-CM | POA: Insufficient documentation

## 2016-09-18 DIAGNOSIS — R05 Cough: Secondary | ICD-10-CM | POA: Diagnosis not present

## 2016-09-18 DIAGNOSIS — I10 Essential (primary) hypertension: Secondary | ICD-10-CM | POA: Diagnosis not present

## 2016-09-18 MED ORDER — LEVOFLOXACIN 750 MG PO TABS
750.0000 mg | ORAL_TABLET | Freq: Once | ORAL | Status: AC
Start: 2016-09-18 — End: 2016-09-18
  Administered 2016-09-18: 750 mg via ORAL
  Filled 2016-09-18: qty 1

## 2016-09-18 MED ORDER — HYDROCODONE-HOMATROPINE 5-1.5 MG/5ML PO SYRP
5.0000 mL | ORAL_SOLUTION | Freq: Once | ORAL | Status: AC
  Administered 2016-09-18: 5 mL via ORAL
  Filled 2016-09-18: qty 5

## 2016-09-18 NOTE — ED Triage Notes (Signed)
Pt complaining of productive cough. Pt states coughing up some yellowish phlegm. Pt denies any chest pain or tightness. Pt states hx of asthma, no wheezing at triage. Pt denies any nausea or vomiting, no fevers at home.

## 2016-09-18 NOTE — ED Provider Notes (Signed)
[facility]EM[facility]ENCY DEPT Provider Note   CSN: [id number] Arrival date & time: 09/18/16  2142     History   Chief Complaint Chief Complaint  Patient presents with  . Cough    HPI [name] is a 55 y.o. female.  Patient presents to the emergency department with chief complaint of cough 1 week. She reports associated fevers chills at home. She denies any nausea, vomiting, or diarrhea. She reports occasional shortness of breath, and states that coughing makes her chest ache. She also reports having some palpitations, which is not new for her. She has not tried taking anything except for NyQuil for her symptoms. There are no other associated symptoms. There are no other modifying factors.   The history is provided by the patient. No language interpreter was used.    Past Medical History:  Diagnosis Date  . Hypertension     Patient Active Problem List   Diagnosis Date Noted  . Essential hypertension 03/03/2014    History reviewed. No pertinent surgical history.  OB History    No data available       Home Medications    Prior to Admission medications   Medication Sig Start Date End Date Taking? Authorizing Provider  albuterol (PROVENTIL HFA;VENTOLIN HFA) 108 (90 BASE) MCG/ACT inhaler Inhale 1-2 puffs into the lungs every 6 (six) hours as needed for wheezing or shortness of breath.    Historical Provider, MD  atenolol (TENORMIN) 50 MG tablet Take 50 mg by mouth daily.      Historical Provider, MD  loratadine (CLARITIN) 10 MG tablet Take 10 mg by mouth daily as needed for allergies or itching.    Historical Provider, MD  predniSONE (DELTASONE) 20 MG tablet Take 3 tablets (60 mg total) by mouth daily with breakfast. 01/24/14   Nadara Mustard, MD    Family History Family History  Problem Relation Age of Onset  . Congestive Heart Failure Mother     Social History Social History  Substance Use Topics  . Smoking status: Never Smoker  . Smokeless tobacco: Never Used  .  Alcohol use No     Allergies   Amoxicillin and Shellfish allergy   Review of Systems Review of Systems  All other systems reviewed and are negative.    Physical Exam Updated Vital Signs BP 142/75 (BP Location: Left Arm)   Pulse 74   Temp 98.4 F (36.9 C) (Oral)   Resp 16   Ht 5\' 6"  (1.676 m)   SpO2 99%   Physical Exam  Constitutional: She is oriented to person, place, and time. She appears well-developed and well-nourished.  HENT:  Head: Normocephalic and atraumatic.  Eyes: Conjunctivae and EOM are normal. Pupils are equal, round, and reactive to light.  Neck: Normal range of motion. Neck supple.  Cardiovascular: Normal rate and regular rhythm.  Exam reveals no gallop and no friction rub.   No murmur heard. Pulmonary/Chest: Effort normal and breath sounds normal. No respiratory distress. She has no wheezes. She has no rales. She exhibits no tenderness.  Abdominal: Soft. Bowel sounds are normal. She exhibits no distension and no mass. There is no tenderness. There is no rebound and no guarding.  Musculoskeletal: Normal range of motion. She exhibits no edema or tenderness.  Neurological: She is alert and oriented to person, place, and time.  Skin: Skin is warm and dry.  Psychiatric: She has a normal mood and affect. Her behavior is normal. Judgment and thought content normal.  Nursing note and vitals reviewed.  ED Treatments / Results  Labs (all labs ordered are listed, but only abnormal results are displayed) Labs Reviewed - No data to display  EKG  EKG Interpretation  Date/Time:  Friday September 18 2016 23:58:29 EDT Ventricular Rate:  74 PR Interval:    QRS Duration: 104 QT Interval:  424 QTC Calculation: 471 R Axis:   41 Text Interpretation:  Sinus rhythm Low voltage, precordial leads Nonspecific T abnormalities, diffuse leads No old tracing to compare Confirmed by WARD,  DO, KRISTEN ([id number]) on 09/19/2016 12:02:12 AM       Radiology Dg Chest 2  View  Result Date: 09/18/2016 CLINICAL DATA:  Productive cough and shortness of breath over the last 2 weeks. EXAM: CHEST  2 VIEW COMPARISON:  None. FINDINGS: Heart size is normal. Mediastinal shadows are normal. There is bronchopneumonia affecting the lingula and possibly to a minor extent the right middle lobe. No dense consolidation or collapse. No effusions. No acute bone finding. Old left clavicle fracture. IMPRESSION: Lingular bronchopneumonia. Possible mild involvement of the right middle lobe as well. Electronically Signed   By: Paulina FusiMark  Shogry M.D.   On: 09/18/2016 22:33    Procedures Procedures (including critical care time)  Medications Ordered in ED Medications - No data to display   Initial Impression / Assessment and Plan / ED Course  I have reviewed the triage vital signs and the nursing notes.  Pertinent labs & imaging results that were available during my care of the patient were reviewed by me and considered in my medical decision making (see chart for details).  Clinical Course    Patient with cough 1 week. Intermittent fevers and chills. Will treat with Levaquin.    EKG shows non-specific t-wave changes.  Reviewed with Dr. Elesa MassedWard, who recommends checking a troponin and labs.  DC if normal.  Troponin is negative. Will discharge to home with Levaquin and Hycodan.  Final Clinical Impressions(s) / ED Diagnoses   Final diagnoses:  Community acquired pneumonia of left lung, unspecified part of lung    New Prescriptions New Prescriptions   No medications on file     Roxy HorsemanRobert Marlina Cataldi, PA-C 09/19/16 0102    Roxy Horsemanobert Terrel Nesheiwat, PA-C 09/19/16 0105    Benjiman CoreNathan Pickering, MD 09/19/16 1453

## 2016-09-19 LAB — I-STAT CHEM 8, ED
BUN: 13 mg/dL (ref 6–20)
CHLORIDE: 102 mmol/L (ref 101–111)
CREATININE: 0.7 mg/dL (ref 0.44–1.00)
Calcium, Ion: 1.07 mmol/L — ABNORMAL LOW (ref 1.15–1.40)
GLUCOSE: 137 mg/dL — AB (ref 65–99)
HCT: 37 % (ref 36.0–46.0)
Hemoglobin: 12.6 g/dL (ref 12.0–15.0)
Potassium: 3.3 mmol/L — ABNORMAL LOW (ref 3.5–5.1)
SODIUM: 141 mmol/L (ref 135–145)
TCO2: 27 mmol/L (ref 0–100)

## 2016-09-19 LAB — CBC WITH DIFFERENTIAL/PLATELET
Basophils Absolute: 0 10*3/uL (ref 0.0–0.1)
Basophils Relative: 0 %
EOS ABS: 0.2 10*3/uL (ref 0.0–0.7)
Eosinophils Relative: 2 %
HEMATOCRIT: 35.8 % — AB (ref 36.0–46.0)
HEMOGLOBIN: 12.5 g/dL (ref 12.0–15.0)
LYMPHS ABS: 2.3 10*3/uL (ref 0.7–4.0)
LYMPHS PCT: 24 %
MCH: 31.5 pg (ref 26.0–34.0)
MCHC: 34.9 g/dL (ref 30.0–36.0)
MCV: 90.2 fL (ref 78.0–100.0)
MONOS PCT: 5 %
Monocytes Absolute: 0.5 10*3/uL (ref 0.1–1.0)
NEUTROS PCT: 69 %
Neutro Abs: 6.7 10*3/uL (ref 1.7–7.7)
Platelets: 361 10*3/uL (ref 150–400)
RBC: 3.97 MIL/uL (ref 3.87–5.11)
RDW: 12.6 % (ref 11.5–15.5)
WBC: 9.7 10*3/uL (ref 4.0–10.5)

## 2016-09-19 LAB — I-STAT TROPONIN, ED: Troponin i, poc: 0 ng/mL (ref 0.00–0.08)

## 2016-09-19 MED ORDER — LEVOFLOXACIN 750 MG PO TABS: 750.0000 mg | ORAL_TABLET | Freq: Every day | ORAL | 0 refills | Status: DC

## 2016-09-19 MED ORDER — HYDROCODONE-HOMATROPINE 5-1.5 MG/5ML PO SYRP: 5.0000 mL | ORAL_SOLUTION | Freq: Four times a day (QID) | ORAL | 0 refills | Status: DC | PRN

## 2016-09-19 NOTE — ED Notes (Signed)
Patient left at this time with all belongings, refused wheelchair. 

## 2016-09-25 DIAGNOSIS — J189 Pneumonia, unspecified organism: Secondary | ICD-10-CM | POA: Diagnosis not present

## 2016-10-07 ENCOUNTER — Ambulatory Visit
Admission: RE | Admit: 2016-10-07 | Discharge: 2016-10-07 | Disposition: A | Discharge status: Home / Self Care | Payer: BLUE CROSS/BLUE SHIELD | Source: Ambulatory Visit | Attending: Family Medicine | Admitting: Family Medicine

## 2016-10-07 ENCOUNTER — Other Ambulatory Visit: Payer: Self-pay | Admitting: Family Medicine

## 2016-10-07 DIAGNOSIS — J189 Pneumonia, unspecified organism: Secondary | ICD-10-CM

## 2016-10-07 DIAGNOSIS — R0602 Shortness of breath: Secondary | ICD-10-CM | POA: Diagnosis not present

## 2016-10-07 DIAGNOSIS — R05 Cough: Secondary | ICD-10-CM | POA: Diagnosis not present

## 2016-11-05 ENCOUNTER — Encounter: Payer: Self-pay | Admitting: Cardiology

## 2016-11-06 ENCOUNTER — Telehealth: Payer: Self-pay | Admitting: Cardiology

## 2016-11-06 DIAGNOSIS — K219 Gastro-esophageal reflux disease without esophagitis: Secondary | ICD-10-CM | POA: Diagnosis not present

## 2016-11-06 DIAGNOSIS — I10 Essential (primary) hypertension: Secondary | ICD-10-CM | POA: Diagnosis not present

## 2016-11-06 DIAGNOSIS — R002 Palpitations: Secondary | ICD-10-CM | POA: Diagnosis not present

## 2016-11-06 NOTE — Telephone Encounter (Signed)
Spoke with Ms. [name]. She will be a new Dr. Mayford Knifeurner patient on 12/13.  She has been having palpitations and feels irregular beat at her chest and radial pulse site.  Also has been having heartburn and was ordered prilosec BID by PCP.   She denies chest pain but has constant burning since last night.  She called to ask if she could move her appointment up.  I advised her to try to get in with her PCP today and if not go to urgent care or or the ER for her symptoms today.  She verbalizes understanding and agreement.

## 2016-11-06 NOTE — Telephone Encounter (Signed)
New message  1. palps going on and off since October, becoming more frequent 2. SOB sometimes' 3.  No readings 4. Heartburn, back of legs are sore/pain

## 2016-11-10 ENCOUNTER — Encounter (HOSPITAL_COMMUNITY): Payer: Self-pay | Admitting: Emergency Medicine

## 2016-11-10 ENCOUNTER — Emergency Department (HOSPITAL_COMMUNITY)
Admission: EM | Admit: 2016-11-10 | Discharge: 2016-11-10 | Disposition: A | Discharge status: Home / Self Care | Payer: BLUE CROSS/BLUE SHIELD | Attending: Emergency Medicine | Admitting: Emergency Medicine

## 2016-11-10 ENCOUNTER — Emergency Department (HOSPITAL_COMMUNITY): Payer: BLUE CROSS/BLUE SHIELD

## 2016-11-10 DIAGNOSIS — R002 Palpitations: Secondary | ICD-10-CM | POA: Diagnosis not present

## 2016-11-10 DIAGNOSIS — I10 Essential (primary) hypertension: Secondary | ICD-10-CM | POA: Insufficient documentation

## 2016-11-10 DIAGNOSIS — R079 Chest pain, unspecified: Secondary | ICD-10-CM | POA: Diagnosis not present

## 2016-11-10 DIAGNOSIS — Z79899 Other long term (current) drug therapy: Secondary | ICD-10-CM | POA: Insufficient documentation

## 2016-11-10 DIAGNOSIS — Z8541 Personal history of malignant neoplasm of cervix uteri: Secondary | ICD-10-CM | POA: Diagnosis not present

## 2016-11-10 DIAGNOSIS — J45909 Unspecified asthma, uncomplicated: Secondary | ICD-10-CM | POA: Insufficient documentation

## 2016-11-10 DIAGNOSIS — R0789 Other chest pain: Secondary | ICD-10-CM | POA: Diagnosis not present

## 2016-11-10 DIAGNOSIS — R05 Cough: Secondary | ICD-10-CM | POA: Diagnosis not present

## 2016-11-10 DIAGNOSIS — R072 Precordial pain: Secondary | ICD-10-CM | POA: Diagnosis present

## 2016-11-10 LAB — BASIC METABOLIC PANEL
ANION GAP: 12 (ref 5–15)
BUN: 12 mg/dL (ref 6–20)
CO2: 24 mmol/L (ref 22–32)
Calcium: 9.4 mg/dL (ref 8.9–10.3)
Chloride: 102 mmol/L (ref 101–111)
Creatinine, Ser: 0.81 mg/dL (ref 0.44–1.00)
GFR calc non Af Amer: >60 mL/min (ref >60)
GLUCOSE: 110 mg/dL — AB (ref 65–99)
POTASSIUM: 3.6 mmol/L (ref 3.5–5.1)
Sodium: 138 mmol/L (ref 135–145)

## 2016-11-10 LAB — I-STAT TROPONIN, ED
TROPONIN I, POC: 0 ng/mL (ref 0.00–0.08)
TROPONIN I, POC: 0 ng/mL (ref 0.00–0.08)

## 2016-11-10 LAB — CBC
HEMATOCRIT: 42.3 % (ref 36.0–46.0)
HEMOGLOBIN: 14.4 g/dL (ref 12.0–15.0)
MCH: 30.8 pg (ref 26.0–34.0)
MCHC: 34 g/dL (ref 30.0–36.0)
MCV: 90.6 fL (ref 78.0–100.0)
Platelets: 299 10*3/uL (ref 150–400)
RBC: 4.67 MIL/uL (ref 3.87–5.11)
RDW: 13 % (ref 11.5–15.5)
WBC: 8.9 10*3/uL (ref 4.0–10.5)

## 2016-11-10 NOTE — ED Notes (Signed)
IV removed.

## 2016-11-10 NOTE — ED Provider Notes (Signed)
[facility] Provider Note   CSN: [id number] Arrival date & time: 11/10/16  0240     History   Chief Complaint Chief Complaint  Patient presents with  . Chest Pain  . Palpitations    HPI [name] is a 55 y.o. female.  The history is provided by the patient.  Chest Pain   This is a recurrent problem. The problem has been gradually improving. The pain is present in the substernal region. The quality of the pain is described as burning (heart burn). The pain does not radiate. Associated symptoms include palpitations. Pertinent negatives include no fever and no syncope.  Pertinent negatives for past medical history include no CAD, no PE and no strokes.  Her family medical history is significant for CAD.  Palpitations   Associated symptoms include chest pain. Pertinent negatives include no fever and no syncope.  pt reports recurrent h/o chest pain/heart burn as well as palpitations This has been an ongoing issue for quite sometime, but last night it seemed worse.  She had some coughing and mild SOB last night associated with heart burn.  She feels the palpitations are worsening but currently the CP is improving She has seen cardiology in the past and is supposed to see them later this week She used to be on atenolol but no longer at this time No syncope reported  fam hx - CAD  Past Medical History:  Diagnosis Date  . Fe deficiency anemia   . Frequent headaches   . Hypertension   . Mild asthma   . Obesity     Patient Active Problem List   Diagnosis Date Noted  . Essential hypertension 03/03/2014    Past Surgical History:  Procedure Laterality Date  . ABDOMINAL HYSTERECTOMY  2005  . APPENDECTOMY  1984  . CERVICAL CANCER CELLS FROZEN  1991  . DILATION AND CURETTAGE OF UTERUS  1991    OB History    No data available       Home Medications    Prior to Admission medications   Medication Sig Start Date End Date Taking? Authorizing Provider  albuterol  (PROVENTIL HFA;VENTOLIN HFA) 108 (90 Base) MCG/ACT inhaler Inhale 2 puffs into the lungs every 6 (six) hours as needed for wheezing or shortness of breath.   Yes Historical Provider, MD  amLODipine (NORVASC) 5 MG tablet Take 5 mg by mouth daily.   Yes Historical Provider, MD  dexlansoprazole (DEXILANT) 60 MG capsule Take 60 mg by mouth daily.   Yes Historical Provider, MD  hydrochlorothiazide (HYDRODIURIL) 25 MG tablet Take 12.5 mg by mouth daily.   Yes Historical Provider, MD  HYDROcodone-homatropine (HYCODAN) 5-1.5 MG/5ML syrup Take 5 mLs by mouth every 6 (six) hours as needed for cough. 09/19/16  Yes Roxy Horsemanobert Browning, PA-C    Family History Family History  Problem Relation Age of Onset  . Congestive Heart Failure Mother     Social History Social History  Substance Use Topics  . Smoking status: Never Smoker  . Smokeless tobacco: Never Used  . Alcohol use No     Allergies   Amoxicillin and Shellfish allergy   Review of Systems Review of Systems  Constitutional: Negative for fever.  Cardiovascular: Positive for chest pain and palpitations. Negative for syncope.  Neurological: Negative for syncope.  All other systems reviewed and are negative.    Physical Exam Updated Vital Signs BP 131/62 (BP Location: Right Arm)   Pulse 76   Temp 98 F (36.7 C) (Oral)  Resp 20   Ht 5\' 6"  (1.676 m)   Wt 108.9 kg   SpO2 100%   BMI 38.74 kg/m   Physical Exam  CONSTITUTIONAL: Well developed/well nourished HEAD: Normocephalic/atraumatic EYES: EOMI ENMT: Mucous membranes moist NECK: supple no meningeal signs SPINE/BACK:entire spine nontender CV: S1/S2 noted, no murmurs/rubs/gallops noted LUNGS: Lungs are clear to auscultation bilaterally, no apparent distress ABDOMEN: soft, nontender GU:no cva tenderness NEURO: Pt is awake/alert/appropriate, moves all extremitiesx4.  No facial droop.   EXTREMITIES: pulses normal/equal, full ROM SKIN: warm, color normal PSYCH: no abnormalities  of mood noted, alert and oriented to situation  ED Treatments / Results  Labs (all labs ordered are listed, but only abnormal results are displayed) Labs Reviewed  BASIC METABOLIC PANEL - Abnormal; Notable for the following:       Result Value   Glucose, Bld 110 (*)    All other components within normal limits  CBC  I-STAT TROPOININ, ED  I-STAT TROPOININ, ED    EKG  EKG Interpretation  Date/Time:  Tuesday November 10 2016 05:48:50 EST Ventricular Rate:  91 PR Interval:  142 QRS Duration: 102 QT Interval:  402 QTC Calculation: 415 R Axis:   24 Text Interpretation:  Sinus rhythm Multiform ventricular premature complexes Low voltage, precordial leads Nonspecific T abnormalities, diffuse leads Confirmed by Bebe ShaggyWICKLINE  MD, Dimitra Woodstock ([id number]) on 11/10/2016 5:57:25 AM       Radiology Dg Chest 2 View  Result Date: 11/10/2016 CLINICAL DATA:  55 y/o F; pontine cough, chest pain, and palpitations since pneumonia in October. EXAM: CHEST  2 VIEW COMPARISON:  10/07/2016 chest radiograph FINDINGS: The heart size and mediastinal contours are within normal limits and stable. Both lungs are clear. Mild S-shaped curvature of the spine. IMPRESSION: No active cardiopulmonary disease. Electronically Signed   By: Mitzi HansenLance  Furusawa-Stratton M.D.   On: 11/10/2016 03:23    Procedures Procedures (including critical care time)  Medications Ordered in ED Medications - No data to display   Initial Impression / Assessment and Plan / ED Course  I have reviewed the triage vital signs and the nursing notes.  Pertinent labs & imaging results that were available during my care of the patient were reviewed by me and considered in my medical decision making (see chart for details).  Clinical Course     HEART score 3 with 2 negative troponins CP is resolved and she admits this is recurrent and similar to prior episodes She is also concerned for palpitations, but has had this previously Denies  syncope Multiple EKGs reveal isolated PAC/PVC but no other dysrhythmia Vitals reassuring No hypoxia noted, I doubt PE at this time She should f/u with cardiology this week   Final Clinical Impressions(s) / ED Diagnoses   Final diagnoses:  Chest pain, unspecified type  Palpitations    New Prescriptions Discharge Medication List as of 11/10/2016  6:43 AM       Zadie Rhineonald Kweku Stankey, MD 11/10/16 [phone]

## 2016-11-10 NOTE — ED Triage Notes (Signed)
Pt. reports intermittent central chest pain " dull" with palpitations for 1 week , mild SOB and occasional productive cough , denies emesis or diaphoresis .

## 2016-11-11 ENCOUNTER — Encounter (INDEPENDENT_AMBULATORY_CARE_PROVIDER_SITE_OTHER): Payer: Self-pay

## 2016-11-11 ENCOUNTER — Ambulatory Visit (INDEPENDENT_AMBULATORY_CARE_PROVIDER_SITE_OTHER): Payer: BLUE CROSS/BLUE SHIELD | Admitting: Cardiology

## 2016-11-11 VITALS — BP 132/81 | HR 72 | Ht 66.0 in | Wt 243.0 lb

## 2016-11-11 DIAGNOSIS — R9431 Abnormal electrocardiogram [ECG] [EKG]: Secondary | ICD-10-CM

## 2016-11-11 DIAGNOSIS — R079 Chest pain, unspecified: Secondary | ICD-10-CM

## 2016-11-11 DIAGNOSIS — I10 Essential (primary) hypertension: Secondary | ICD-10-CM

## 2016-11-11 DIAGNOSIS — M79606 Pain in leg, unspecified: Secondary | ICD-10-CM | POA: Insufficient documentation

## 2016-11-11 DIAGNOSIS — R002 Palpitations: Secondary | ICD-10-CM | POA: Diagnosis not present

## 2016-11-11 NOTE — Progress Notes (Signed)
Cardiology Office Note    Date:  11/11/2016   ID:  Vanessa BudFelita Goodloe, DOB [birth year], MRN [id number]  PCP:  Darrow BussingKOIRALA,DIBAS, MD  Cardiologist:  Armanda Magicraci Dedria Endres, MD   Chief Complaint  Patient presents with  . Chest Pain  . Palpitations    History of Present Illness:  [name] is a 55 y.o. female for evaluation of palpitations. She says that she has had palpitations for years and was evaluated by Dr. Anne FuSkains years ago.  These have increased in severity and frequency.  She denies any dizziness with them but is makes her cough and feel SOB.  She went to the ER the other yesterday due to her heart racing and SOB.  She was also complaining of CP at that time.  She describes it as a burning pain and she has a history of GERD.  She took baking soda and water which resolved it.  She has a history of HTN.  EKG in ER showed occasional PVCs and PAC's.  Trop was negative. Labs were normal.  EKG showed NSR with PVCs and PACs and diffuse T wave abnormality.     Past Medical History:  Diagnosis Date  . Fe deficiency anemia   . Frequent headaches   . Hypertension   . Mild asthma   . Obesity     Past Surgical History:  Procedure Laterality Date  . ABDOMINAL HYSTERECTOMY  2005  . APPENDECTOMY  1984  . CERVICAL CANCER CELLS FROZEN  1991  . DILATION AND CURETTAGE OF UTERUS  1991    Current Medications: Outpatient Medications Prior to Visit  Medication Sig Dispense Refill  . albuterol (PROVENTIL HFA;VENTOLIN HFA) 108 (90 Base) MCG/ACT inhaler Inhale 2 puffs into the lungs every 6 (six) hours as needed for wheezing or shortness of breath.    Marland Kitchen. amLODipine (NORVASC) 5 MG tablet Take 5 mg by mouth daily.    Marland Kitchen. dexlansoprazole (DEXILANT) 60 MG capsule Take 60 mg by mouth daily.    . hydrochlorothiazide (HYDRODIURIL) 25 MG tablet Take 12.5 mg by mouth daily.    Marland Kitchen. HYDROcodone-homatropine (HYCODAN) 5-1.5 MG/5ML syrup Take 5 mLs by mouth every 6 (six) hours as needed for cough. 120 mL 0   No  facility-administered medications prior to visit.      Allergies:   Amoxicillin and Shellfish allergy   Social History   Social History  . Marital status: Married    Spouse name: N/A  . Number of children: 2  . Years of education: N/A   Occupational History  . [employer]    Social History Main Topics  . Smoking status: Never Smoker  . Smokeless tobacco: Never Used  . Alcohol use No  . Drug use: No  . Sexual activity: Not on file   Other Topics Concern  . Not on file   Social History Narrative  . No narrative on file     Family History:  The patient's family history includes Congestive Heart Failure in her mother.   ROS:   Please see the history of present illness.    ROS All other systems reviewed and are negative.  No flowsheet data found.     PHYSICAL EXAM:   VS:  BP 132/81   Pulse 72   Ht 5\' 6"  (1.676 m)   Wt 243 lb (110.2 kg)   BMI 39.22 kg/m    GEN: Well nourished, well developed, in no acute distress  HEENT: normal  Neck: no JVD, carotid bruits, or masses Cardiac:  RRR; no murmurs, rubs, or gallops,no edema.  Intact distal pulses bilaterally.  Respiratory:  clear to auscultation bilaterally, normal work of breathing GI: soft, nontender, nondistended, + BS MS: no deformity or atrophy  Skin: warm and dry, no rash Neuro:  Alert and Oriented x 3, Strength and sensation are intact Psych: euthymic mood, full affect  Wt Readings from Last 3 Encounters:  11/11/16 243 lb (110.2 kg)  11/10/16 240 lb (108.9 kg)  01/24/14 222 lb (100.7 kg)      Studies/Labs Reviewed:   EKG:  EKG is  ordered today and showed NSR at 71bpm with nonspecific T wave abnormality in the anterior and inferolateral leads  Recent Labs: 11/10/2016: BUN 12; Creatinine, Ser 0.81; Hemoglobin 14.4; Platelets 299; Potassium 3.6; Sodium 138   Lipid Panel No results found for: CHOL, TRIG, HDL, CHOLHDL, VLDL, LDLCALC, LDLDIRECT  Additional studies/ records that were reviewed today  include:  EKG, labs and ER notes.    ASSESSMENT:    1. Heart palpitations   2. Chest pain, unspecified type   3. Pain of lower extremity, unspecified laterality   4. Essential hypertension      PLAN:  In order of problems listed above:  1. Heart palpitations - I suspect she is feeling PVCs and PACs that were noted on EKG but will get an event monitor to determine if she is having any other arrhythmias. 2. Chest pain - somewhat atypical and burning that could be due to GERD but she has an abnormal EKG and CRFs including HTN, obesity and family history of heart disease.   3. LE pain behind the knees for several weeks.  She has no swelling but is concerned that she could have blood clots in her legs.  She has not had any long distance travel recently.  I will check LE venous dopplers.   4. HTN - BP controlled on current meds.  Her PCP changed her Atenolol to amlodipine and diuretic because she told the patient that the atenolol was causing her palpitations.  I toler her to go back on the atenolol for now and hold the amlodipine in the mean time.     Medication Adjustments/Labs and Tests Ordered: Current medicines are reviewed at length with the patient today.  Concerns regarding medicines are outlined above.  Medication changes, Labs and Tests ordered today are listed in the Patient Instructions below.  There are no Patient Instructions on file for this visit.   Signed, Armanda Magicraci Asherah Lavoy, MD  11/11/2016 12:04 PM    [facility] [address], [place], [place]  [address] Phone: [phone]; Fax: [phone]

## 2016-11-11 NOTE — Patient Instructions (Signed)
Medication Instructions:  Your physician recommends that you continue on your current medications as directed. Please refer to the Current Medication list given to you today.   Labwork: None  Testing/Procedures: Your physician has requested that you have an echocardiogram. Echocardiography is a painless test that uses sound waves to create images of your heart. It provides your doctor with information about the size and shape of your heart and how well your heart's chambers and valves are working. This procedure takes approximately one hour. There are no restrictions for this procedure.   Your physician has recommended that you wear an event monitor. Event monitors are medical devices that record the heart's electrical activity. Doctors most often us these monitors to diagnose arrhythmias. Arrhythmias are problems with the speed or rhythm of the heartbeat. The monitor is a small, portable device. You can wear one while you do your normal daily activities. This is usually used to diagnose what is causing palpitations/syncope (passing out).   Your physician has requested that you have a lower extremity arterial duplex. During this test, ultrasound are used to evaluate arterial blood flow in the legs. Allow one hour for this exam. There are no restrictions or special instructions.   Dr. Mayford Knifeurner recommends you have an EXERCISE MYOVIEW.  Follow-Up: Your physician recommends that you schedule a follow-up appointment AS NEEDED with Dr. Mayford Knifeurner pending study results.  Any Other Special Instructions Will Be Listed Below (If Applicable).     If you need a refill on your cardiac medications before your next appointment, please call your pharmacy.

## 2016-11-12 NOTE — Addendum Note (Signed)
Addended by: Oleta MouseVERTON, Alyxandra Tenbrink M on: 11/12/2016 11:05 AM   Modules accepted: Orders

## 2016-11-16 ENCOUNTER — Ambulatory Visit (INDEPENDENT_AMBULATORY_CARE_PROVIDER_SITE_OTHER): Payer: BLUE CROSS/BLUE SHIELD

## 2016-11-16 ENCOUNTER — Telehealth (HOSPITAL_COMMUNITY): Payer: Self-pay | Admitting: *Deleted

## 2016-11-16 DIAGNOSIS — R002 Palpitations: Secondary | ICD-10-CM

## 2016-11-16 NOTE — Telephone Encounter (Signed)
Patient given detailed instructions per Myocardial Perfusion Study Information Sheet for the test on 11/18/16 at 1230. Patient notified to arrive 15 minutes early and that it is imperative to arrive on time for appointment to keep from having the test rescheduled.  If you need to cancel or reschedule your appointment, please call the office within 24 hours of your appointment. Failure to do so may result in a cancellation of your appointment, and a $50 no show fee. Patient verbalized understanding.[name]    

## 2016-11-18 ENCOUNTER — Ambulatory Visit (HOSPITAL_COMMUNITY): Payer: BLUE CROSS/BLUE SHIELD | Attending: Internal Medicine

## 2016-11-18 DIAGNOSIS — R9431 Abnormal electrocardiogram [ECG] [EKG]: Secondary | ICD-10-CM | POA: Insufficient documentation

## 2016-11-18 DIAGNOSIS — R002 Palpitations: Secondary | ICD-10-CM | POA: Diagnosis not present

## 2016-11-18 DIAGNOSIS — I10 Essential (primary) hypertension: Secondary | ICD-10-CM | POA: Insufficient documentation

## 2016-11-18 DIAGNOSIS — R079 Chest pain, unspecified: Secondary | ICD-10-CM | POA: Diagnosis not present

## 2016-11-18 MED ORDER — TECHNETIUM TC 99M TETROFOSMIN IV KIT
32.9000 | PACK | Freq: Once | INTRAVENOUS | Status: AC | PRN
  Administered 2016-11-18: 32.9 via INTRAVENOUS
  Filled 2016-11-18: qty 33

## 2016-11-19 ENCOUNTER — Ambulatory Visit (HOSPITAL_COMMUNITY): Payer: BLUE CROSS/BLUE SHIELD | Attending: Internal Medicine

## 2016-11-19 LAB — MYOCARDIAL PERFUSION IMAGING
CHL CUP MPHR: 166 {beats}/min
CHL CUP NUCLEAR SDS: 0
CHL CUP NUCLEAR SRS: 4
CHL CUP NUCLEAR SSS: 4
CHL CUP RESTING HR STRESS: 65 {beats}/min
CHL RATE OF PERCEIVED EXERTION: 17
CSEPEDS: 0 s
CSEPEW: 8.5 METS
Exercise duration (min): 7 min
LV dias vol: 131 mL (ref 46–106)
LVSYSVOL: 62 mL
NUC STRESS TID: 0.99
Peak HR: 150 {beats}/min
Percent HR: 90 %
RATE: 0.26

## 2016-11-19 MED ORDER — TECHNETIUM TC 99M TETROFOSMIN IV KIT
31.6000 | PACK | Freq: Once | INTRAVENOUS | Status: AC | PRN
  Administered 2016-11-19: 31.6 via INTRAVENOUS
  Filled 2016-11-19: qty 32

## 2016-12-01 ENCOUNTER — Other Ambulatory Visit: Payer: Self-pay

## 2016-12-01 ENCOUNTER — Ambulatory Visit (HOSPITAL_COMMUNITY): Payer: BLUE CROSS/BLUE SHIELD | Attending: Cardiovascular Disease

## 2016-12-01 DIAGNOSIS — R9431 Abnormal electrocardiogram [ECG] [EKG]: Secondary | ICD-10-CM | POA: Insufficient documentation

## 2016-12-08 ENCOUNTER — Ambulatory Visit (HOSPITAL_COMMUNITY)
Admission: RE | Admit: 2016-12-08 | Discharge: 2016-12-08 | Disposition: A | Discharge status: Home / Self Care | Payer: BLUE CROSS/BLUE SHIELD | Source: Ambulatory Visit | Attending: Cardiology | Admitting: Cardiology

## 2016-12-08 DIAGNOSIS — M79606 Pain in leg, unspecified: Secondary | ICD-10-CM | POA: Diagnosis not present

## 2016-12-08 DIAGNOSIS — M79609 Pain in unspecified limb: Secondary | ICD-10-CM | POA: Diagnosis not present

## 2016-12-24 ENCOUNTER — Telehealth: Payer: Self-pay | Admitting: *Deleted

## 2016-12-24 NOTE — Telephone Encounter (Signed)
t notified of monitor results and findings. Pt was anxious about why she feels her heart beat fast sometimes. I reassured pt monitor results were good news that there were no abnormal rhythms. Pt verbalized understanding to results given today.

## 2017-01-19 DIAGNOSIS — Z6838 Body mass index (BMI) 38.0-38.9, adult: Secondary | ICD-10-CM | POA: Diagnosis not present

## 2017-01-19 DIAGNOSIS — Z1151 Encounter for screening for human papillomavirus (HPV): Secondary | ICD-10-CM | POA: Diagnosis not present

## 2017-01-19 DIAGNOSIS — Z01419 Encounter for gynecological examination (general) (routine) without abnormal findings: Secondary | ICD-10-CM | POA: Diagnosis not present

## 2017-07-20 DIAGNOSIS — R102 Pelvic and perineal pain: Secondary | ICD-10-CM | POA: Diagnosis not present

## 2017-08-09 ENCOUNTER — Ambulatory Visit
Admission: RE | Admit: 2017-08-09 | Discharge: 2017-08-09 | Disposition: A | Discharge status: Home / Self Care | Payer: BLUE CROSS/BLUE SHIELD | Source: Ambulatory Visit | Attending: Obstetrics and Gynecology | Admitting: Obstetrics and Gynecology

## 2017-08-09 ENCOUNTER — Other Ambulatory Visit: Payer: Self-pay | Admitting: Obstetrics and Gynecology

## 2017-08-09 DIAGNOSIS — Z1231 Encounter for screening mammogram for malignant neoplasm of breast: Secondary | ICD-10-CM | POA: Diagnosis not present

## 2017-08-09 DIAGNOSIS — Z0000 Encounter for general adult medical examination without abnormal findings: Secondary | ICD-10-CM | POA: Diagnosis not present

## 2017-08-09 DIAGNOSIS — R102 Pelvic and perineal pain: Secondary | ICD-10-CM | POA: Diagnosis not present

## 2017-08-11 ENCOUNTER — Other Ambulatory Visit: Payer: Self-pay | Admitting: Obstetrics and Gynecology

## 2017-08-11 DIAGNOSIS — R928 Other abnormal and inconclusive findings on diagnostic imaging of breast: Secondary | ICD-10-CM

## 2017-08-16 ENCOUNTER — Ambulatory Visit: Payer: BLUE CROSS/BLUE SHIELD

## 2017-08-16 ENCOUNTER — Ambulatory Visit
Admission: RE | Admit: 2017-08-16 | Discharge: 2017-08-16 | Disposition: A | Discharge status: Home / Self Care | Payer: BLUE CROSS/BLUE SHIELD | Source: Ambulatory Visit | Attending: Obstetrics and Gynecology | Admitting: Obstetrics and Gynecology

## 2017-08-16 DIAGNOSIS — R928 Other abnormal and inconclusive findings on diagnostic imaging of breast: Secondary | ICD-10-CM | POA: Diagnosis not present

## 2017-08-23 DIAGNOSIS — I10 Essential (primary) hypertension: Secondary | ICD-10-CM | POA: Diagnosis not present

## 2017-08-24 ENCOUNTER — Other Ambulatory Visit: Payer: Self-pay | Admitting: Gastroenterology

## 2017-08-24 DIAGNOSIS — R1011 Right upper quadrant pain: Secondary | ICD-10-CM | POA: Diagnosis not present

## 2017-08-24 DIAGNOSIS — Z1211 Encounter for screening for malignant neoplasm of colon: Secondary | ICD-10-CM | POA: Diagnosis not present

## 2017-08-24 DIAGNOSIS — Z86010 Personal history of colonic polyps: Secondary | ICD-10-CM | POA: Diagnosis not present

## 2017-08-24 DIAGNOSIS — R112 Nausea with vomiting, unspecified: Secondary | ICD-10-CM

## 2017-08-24 DIAGNOSIS — K219 Gastro-esophageal reflux disease without esophagitis: Secondary | ICD-10-CM | POA: Diagnosis not present

## 2017-08-24 NOTE — Progress Notes (Signed)
Ritha Sampedro MD 

## 2017-09-08 ENCOUNTER — Encounter (HOSPITAL_COMMUNITY)
Admission: RE | Admit: 2017-09-08 | Discharge: 2017-09-08 | Disposition: A | Discharge status: Home / Self Care | Payer: BLUE CROSS/BLUE SHIELD | Source: Ambulatory Visit | Attending: Gastroenterology | Admitting: Gastroenterology

## 2017-09-08 ENCOUNTER — Ambulatory Visit (HOSPITAL_COMMUNITY)
Admission: RE | Admit: 2017-09-08 | Discharge: 2017-09-08 | Disposition: A | Discharge status: Home / Self Care | Payer: BLUE CROSS/BLUE SHIELD | Source: Ambulatory Visit | Attending: Gastroenterology | Admitting: Gastroenterology

## 2017-09-08 DIAGNOSIS — R1011 Right upper quadrant pain: Secondary | ICD-10-CM | POA: Insufficient documentation

## 2017-09-08 DIAGNOSIS — R112 Nausea with vomiting, unspecified: Secondary | ICD-10-CM | POA: Insufficient documentation

## 2017-09-08 MED ORDER — TECHNETIUM TC 99M MEBROFENIN IV KIT
5.2000 | PACK | Freq: Once | INTRAVENOUS | Status: AC | PRN
  Administered 2017-09-08: 5.2 via INTRAVENOUS

## 2017-09-13 ENCOUNTER — Encounter: Payer: Self-pay | Admitting: Gastroenterology

## 2017-09-13 DIAGNOSIS — Z1211 Encounter for screening for malignant neoplasm of colon: Secondary | ICD-10-CM | POA: Diagnosis not present

## 2017-09-13 DIAGNOSIS — D123 Benign neoplasm of transverse colon: Secondary | ICD-10-CM | POA: Diagnosis not present

## 2017-09-13 DIAGNOSIS — D125 Benign neoplasm of sigmoid colon: Secondary | ICD-10-CM | POA: Diagnosis not present

## 2017-09-13 DIAGNOSIS — K635 Polyp of colon: Secondary | ICD-10-CM | POA: Diagnosis not present

## 2017-09-16 ENCOUNTER — Encounter: Payer: Self-pay | Admitting: Gastroenterology

## 2017-10-05 DIAGNOSIS — K219 Gastro-esophageal reflux disease without esophagitis: Secondary | ICD-10-CM | POA: Diagnosis not present

## 2017-10-05 DIAGNOSIS — R1011 Right upper quadrant pain: Secondary | ICD-10-CM | POA: Diagnosis not present

## 2017-10-22 DIAGNOSIS — R1013 Epigastric pain: Secondary | ICD-10-CM | POA: Diagnosis not present

## 2017-10-23 ENCOUNTER — Emergency Department (HOSPITAL_COMMUNITY)
Admission: EM | Admit: 2017-10-23 | Discharge: 2017-10-24 | Disposition: A | Discharge status: Home / Self Care | Payer: BLUE CROSS/BLUE SHIELD | Attending: Emergency Medicine | Admitting: Emergency Medicine

## 2017-10-23 ENCOUNTER — Emergency Department (HOSPITAL_COMMUNITY): Payer: BLUE CROSS/BLUE SHIELD

## 2017-10-23 ENCOUNTER — Encounter (HOSPITAL_COMMUNITY): Payer: Self-pay | Admitting: Emergency Medicine

## 2017-10-23 DIAGNOSIS — Z79899 Other long term (current) drug therapy: Secondary | ICD-10-CM | POA: Diagnosis not present

## 2017-10-23 DIAGNOSIS — I10 Essential (primary) hypertension: Secondary | ICD-10-CM | POA: Insufficient documentation

## 2017-10-23 DIAGNOSIS — R1013 Epigastric pain: Secondary | ICD-10-CM | POA: Diagnosis not present

## 2017-10-23 DIAGNOSIS — J45909 Unspecified asthma, uncomplicated: Secondary | ICD-10-CM | POA: Diagnosis not present

## 2017-10-23 DIAGNOSIS — R079 Chest pain, unspecified: Secondary | ICD-10-CM | POA: Diagnosis not present

## 2017-10-23 LAB — BASIC METABOLIC PANEL
ANION GAP: 9 (ref 5–15)
BUN: 9 mg/dL (ref 6–20)
CALCIUM: 9 mg/dL (ref 8.9–10.3)
CO2: 25 mmol/L (ref 22–32)
CREATININE: 0.76 mg/dL (ref 0.44–1.00)
Chloride: 106 mmol/L (ref 101–111)
Glucose, Bld: 95 mg/dL (ref 65–99)
Potassium: 3.5 mmol/L (ref 3.5–5.1)
SODIUM: 140 mmol/L (ref 135–145)

## 2017-10-23 LAB — CBC
HCT: 40.5 % (ref 36.0–46.0)
HEMOGLOBIN: 13.6 g/dL (ref 12.0–15.0)
MCH: 30.5 pg (ref 26.0–34.0)
MCHC: 33.6 g/dL (ref 30.0–36.0)
MCV: 90.8 fL (ref 78.0–100.0)
PLATELETS: 278 10*3/uL (ref 150–400)
RBC: 4.46 MIL/uL (ref 3.87–5.11)
RDW: 12.7 % (ref 11.5–15.5)
WBC: 7.2 10*3/uL (ref 4.0–10.5)

## 2017-10-23 LAB — I-STAT TROPONIN, ED: TROPONIN I, POC: 0 ng/mL (ref 0.00–0.08)

## 2017-10-23 MED ORDER — GI COCKTAIL ~~LOC~~
30.0000 mL | Freq: Once | ORAL | Status: AC
  Administered 2017-10-23: 30 mL via ORAL
  Filled 2017-10-23: qty 30

## 2017-10-23 NOTE — ED Triage Notes (Signed)
Reports epigastric pain that radiates into center of chest since Wednesday.  Reports feeling numb in left hand and reported swelling this morning that has gone down.

## 2017-10-23 NOTE — ED Provider Notes (Signed)
[facility] Note   CSN: [id number] Arrival date & time: 10/23/17  2006     History   Chief Complaint Chief Complaint  Patient presents with  . Abdominal Pain    HPI [name] is a 56 y.o. female.  Patient with a history of HTN presents with epigastric pain that is constant with intermittent sharp, stabbing pain. Symptoms started 4 days ago and have been persistent. She reports food and movement make the intermittent pain worse. No fever, nausea, vomiting or diarrhea. She has tried Prilosec and laxatives without relief or change. She reports similar pain recently that led to a GYN evaluation that was normal, including ultrasound, and seeing GI for evaluation that included a HIDA scan that, per patient, showed declining gall bladder function but no gall stones or obstruction. Dietary change and weight loss were recommended.   The history is provided by the patient. No language interpreter was used.    Past Medical History:  Diagnosis Date  . Fe deficiency anemia   . Frequent headaches   . Hypertension   . Mild asthma   . Obesity     Patient Active Problem List   Diagnosis Date Noted  . Heart palpitations 11/11/2016  . Chest pain 11/11/2016  . Leg pain 11/11/2016  . Essential hypertension 03/03/2014    Past Surgical History:  Procedure Laterality Date  . ABDOMINAL HYSTERECTOMY  2005  . APPENDECTOMY  1984  . CERVICAL CANCER CELLS FROZEN  1991  . DILATION AND CURETTAGE OF UTERUS  1991    OB History    No data available       Home Medications    Prior to Admission medications   Medication Sig Start Date End Date Taking? Authorizing Provider  albuterol (PROVENTIL HFA;VENTOLIN HFA) 108 (90 Base) MCG/ACT inhaler Inhale 2 puffs into the lungs every 6 (six) hours as needed for wheezing or shortness of breath.    [provider]  amLODipine (NORVASC) 5 MG tablet Take 5 mg by mouth daily.    [provider]  dexlansoprazole (DEXILANT) 60 MG capsule Take 60 mg by mouth daily.    [provider]  hydrochlorothiazide (HYDRODIURIL) 25 MG tablet Take 12.5 mg by mouth daily.    [provider]    Family History Family History  Problem Relation Age of Onset  . Congestive Heart Failure Mother     Social History Social History   Tobacco Use  . Smoking status: Never Smoker  . Smokeless tobacco: Never Used  Substance Use Topics  . Alcohol use: No  . Drug use: No     Allergies   Amoxicillin and Shellfish allergy   Review of Systems Review of Systems  Constitutional: Negative for chills and fever.  Respiratory: Negative.  Negative for shortness of breath.   Cardiovascular: Negative.   Gastrointestinal: Positive for abdominal pain. Negative for nausea and vomiting.  Musculoskeletal: Negative.   Skin: Negative.   Neurological: Negative.      Physical Exam Updated Vital Signs BP (!) 145/57   Pulse 60   Temp 98.6 F (37 C) (Oral)   Resp 16   Ht 5\' 6"  (1.676 m)   Wt 113.4 kg (250 lb)   SpO2 98%   BMI 40.35 kg/m   Physical Exam  Constitutional: She appears well-developed and well-nourished.  HENT:  Head: Normocephalic.  Neck: Normal range of motion. Neck supple.  Cardiovascular: Normal rate and regular rhythm.  Pulmonary/Chest: Effort normal and  breath sounds normal.  Abdominal: Soft. Bowel sounds are normal. She exhibits no distension. There is no tenderness. There is no rebound and no guarding.  Musculoskeletal: Normal range of motion.  Neurological: She is alert. No cranial nerve deficit.  Skin: Skin is warm and dry. No rash noted.  Psychiatric: She has a normal mood and affect.     ED Treatments / Results  Labs (all labs ordered are listed, but only abnormal results are displayed) Labs Reviewed  BASIC METABOLIC PANEL  CBC  I-STAT TROPONIN, ED    EKG  EKG Interpretation  Date/Time:  Saturday October 23 2017 20:16:30  EST Ventricular Rate:  66 PR Interval:  162 QRS Duration: 86 QT Interval:  424 QTC Calculation: 444 R Axis:   56 Text Interpretation:  Normal sinus rhythm with sinus arrhythmia Nonspecific T wave abnormality Abnormal ECG Confirmed by Lorre NickAllen, Anthony ([id number]) on 10/23/2017 10:22:08 PM       Radiology Dg Chest 2 View  Result Date: 10/23/2017 CLINICAL DATA:  Mid and upper chest pain for several days. EXAM: CHEST  2 VIEW COMPARISON:  11/10/2016 FINDINGS: The heart size and mediastinal contours are within normal limits. Both lungs are clear. The visualized skeletal structures are unremarkable. IMPRESSION: No active cardiopulmonary disease. Electronically Signed   By: Tollie Ethavid  Kwon M.D.   On: 10/23/2017 20:39    Procedures Procedures (including critical care time)  Medications Ordered in ED Medications  gi cocktail (Maalox,Lidocaine,Donnatal) (30 mLs Oral Given 10/23/17 2322)     Initial Impression / Assessment and Plan / ED Course  I have reviewed the triage vital signs and the nursing notes.  Pertinent labs & imaging results that were available during my care of the patient were reviewed by me and considered in my medical decision making (see chart for details).     Patient here with ongoing epigastric pain. She has had GI evaluation, HIDA scan, Urgent Care visits, medications - all without diagnosis or relief.   Here she is afebrile, normal VS, completely normal lab studies. No change with GI cocktail. She is felt stable for discharge home with recommendation to return to GI - Dr. Loreta AveMann. Recommended carafate and Protonix.  Final Clinical Impressions(s) / ED Diagnoses   Final diagnoses:  None   1. Epigastric pain   ED Discharge Orders    None       Elpidio AnisUpstill, Aadyn Buchheit, Cordelia Poche-C 10/24/17 [id number]    Blane OharaZavitz, Joshua, MD 10/24/17 [phone]

## 2017-10-24 MED ORDER — SUCRALFATE 1 G PO TABS: 1.0000 g | ORAL_TABLET | Freq: Three times a day (TID) | ORAL | 0 refills | Status: DC

## 2017-10-24 MED ORDER — PANTOPRAZOLE SODIUM 20 MG PO TBEC: 20.0000 mg | DELAYED_RELEASE_TABLET | Freq: Every day | ORAL | 0 refills | Status: DC

## 2017-10-24 NOTE — Discharge Instructions (Signed)
Take medications for pain as instructed. Call Dr. Loreta AveMann on Monday to schedule an appointment for next week for further management. Return to the emergency department if you have any high fever, severe, pain or new concern.

## 2017-10-29 DIAGNOSIS — R768 Other specified abnormal immunological findings in serum: Secondary | ICD-10-CM | POA: Diagnosis not present

## 2017-10-29 DIAGNOSIS — R1013 Epigastric pain: Secondary | ICD-10-CM | POA: Diagnosis not present

## 2017-10-30 HISTORY — PX: ESOPHAGOGASTRODUODENOSCOPY: SHX1529

## 2017-11-02 DIAGNOSIS — R768 Other specified abnormal immunological findings in serum: Secondary | ICD-10-CM | POA: Diagnosis not present

## 2017-11-09 DIAGNOSIS — K219 Gastro-esophageal reflux disease without esophagitis: Secondary | ICD-10-CM | POA: Diagnosis not present

## 2017-11-09 DIAGNOSIS — R1013 Epigastric pain: Secondary | ICD-10-CM | POA: Diagnosis not present

## 2017-11-29 DIAGNOSIS — K2930 Chronic superficial gastritis without bleeding: Secondary | ICD-10-CM | POA: Diagnosis not present

## 2017-11-29 DIAGNOSIS — K2900 Acute gastritis without bleeding: Secondary | ICD-10-CM | POA: Diagnosis not present

## 2017-11-29 DIAGNOSIS — K210 Gastro-esophageal reflux disease with esophagitis: Secondary | ICD-10-CM | POA: Diagnosis not present

## 2017-11-29 DIAGNOSIS — R1013 Epigastric pain: Secondary | ICD-10-CM | POA: Diagnosis not present

## 2017-11-29 DIAGNOSIS — K228 Other specified diseases of esophagus: Secondary | ICD-10-CM | POA: Diagnosis not present

## 2017-11-29 DIAGNOSIS — K219 Gastro-esophageal reflux disease without esophagitis: Secondary | ICD-10-CM | POA: Diagnosis not present

## 2017-12-02 DIAGNOSIS — K210 Gastro-esophageal reflux disease with esophagitis: Secondary | ICD-10-CM | POA: Diagnosis not present

## 2017-12-02 DIAGNOSIS — K2930 Chronic superficial gastritis without bleeding: Secondary | ICD-10-CM | POA: Diagnosis not present

## 2018-01-07 IMAGING — NM NM HEPATO W/ EF
1 series · 6 of 6 positions shown · non-contrast
Comparison: Abdominal ultrasound -09/09/2027

CLINICAL DATA: Nausea and vomiting. Right upper quadrant abdominal
pain.

EXAM:
NUCLEAR MEDICINE HEPATOBILIARY IMAGING WITH GALLBLADDER EF
TECHNIQUE: Sequential images of the abdomen were obtained [DATE] minutes
following intravenous administration of radiopharmaceutical. After
oral ingestion of Ensure, gallbladder ejection fraction was
determined. At 60 min, normal ejection fraction is greater than 33%.
RADIOPHARMACEUTICALS:  5.0 mCi 9c-KKm  Choletec IV

[he hepatobiliary · 3.07mm/px · 6 of 60 frames shown]
[frame 6/60]
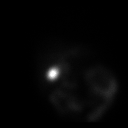
[frame 16/60]
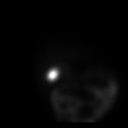
[frame 26/60]
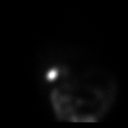
[frame 36/60]
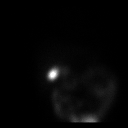
[frame 46/60]
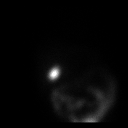
[frame 56/60]
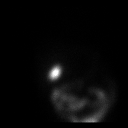

[6 of 6 positions shown; findings below may reference images not displayed]

FINDINGS: There is homogeneous distribution of injected radiotracer throughout
the hepatic parenchyma.

There is early excretion of radiotracer with opacification of the
gallbladder, initially seen on the 15 minute anterior projection
planar image. There is passage of excreted radiotracer with
opacification of the proximal small bowel, initially seen on the 55
minute anterior projection planar image.

Following the ingestion of a fatty meal, there is brisk emptying the
gallbladder with calculated ejection fraction of 62% at 1 hour
(Normal gallbladder ejection fraction with Ensure is greater than
33%.)

The patient did not experience abdominal pain with the ingestion of
ensure.
IMPRESSION: No scintigraphic evidence of acute cholecystitis or biliary
dyskinesia.

## 2018-02-07 DIAGNOSIS — R1013 Epigastric pain: Secondary | ICD-10-CM | POA: Diagnosis not present

## 2018-02-10 ENCOUNTER — Other Ambulatory Visit: Payer: Self-pay | Admitting: Family Medicine

## 2018-02-11 ENCOUNTER — Other Ambulatory Visit: Payer: Self-pay | Admitting: Family Medicine

## 2018-02-11 DIAGNOSIS — R1013 Epigastric pain: Secondary | ICD-10-CM

## 2018-02-28 ENCOUNTER — Ambulatory Visit
Admission: RE | Admit: 2018-02-28 | Discharge: 2018-02-28 | Disposition: A | Discharge status: Home / Self Care | Payer: BLUE CROSS/BLUE SHIELD | Source: Ambulatory Visit | Attending: Family Medicine | Admitting: Family Medicine

## 2018-02-28 DIAGNOSIS — R1013 Epigastric pain: Secondary | ICD-10-CM

## 2018-02-28 DIAGNOSIS — R1011 Right upper quadrant pain: Secondary | ICD-10-CM | POA: Diagnosis not present

## 2018-02-28 MED ORDER — IOPAMIDOL (ISOVUE-370) INJECTION 76%
125.0000 mL | Freq: Once | INTRAVENOUS | Status: AC | PRN
  Administered 2018-02-28: 125 mL via INTRAVENOUS

## 2018-03-07 DIAGNOSIS — R209 Unspecified disturbances of skin sensation: Secondary | ICD-10-CM | POA: Diagnosis not present

## 2018-03-07 DIAGNOSIS — R002 Palpitations: Secondary | ICD-10-CM | POA: Diagnosis not present

## 2018-03-07 DIAGNOSIS — R1011 Right upper quadrant pain: Secondary | ICD-10-CM | POA: Diagnosis not present

## 2018-03-07 DIAGNOSIS — K219 Gastro-esophageal reflux disease without esophagitis: Secondary | ICD-10-CM | POA: Diagnosis not present

## 2018-03-16 DIAGNOSIS — N951 Menopausal and female climacteric states: Secondary | ICD-10-CM | POA: Diagnosis not present

## 2018-03-16 DIAGNOSIS — Z01419 Encounter for gynecological examination (general) (routine) without abnormal findings: Secondary | ICD-10-CM | POA: Diagnosis not present

## 2018-03-16 DIAGNOSIS — R635 Abnormal weight gain: Secondary | ICD-10-CM | POA: Diagnosis not present

## 2018-03-16 DIAGNOSIS — Z6838 Body mass index (BMI) 38.0-38.9, adult: Secondary | ICD-10-CM | POA: Diagnosis not present

## 2018-03-24 ENCOUNTER — Other Ambulatory Visit: Payer: Self-pay | Admitting: Gynecology

## 2018-03-24 DIAGNOSIS — Z1231 Encounter for screening mammogram for malignant neoplasm of breast: Secondary | ICD-10-CM

## 2018-04-19 ENCOUNTER — Telehealth: Payer: Self-pay | Admitting: Cardiology

## 2018-04-19 DIAGNOSIS — R002 Palpitations: Secondary | ICD-10-CM

## 2018-04-19 NOTE — Telephone Encounter (Signed)
Please have her see PCP for GERD sx and get an event monitor for palpitations

## 2018-04-19 NOTE — Telephone Encounter (Signed)
I spoke with patient. Informed her that Dr.Turner recommends f/u with primary MD in regards to [facility] symptoms and get an event monitor to assess for palpitations. Patient in agreement with plan and thankful for the call. Patient was unable to make appt while on the call, she states she will call back to schedule appt for event monitor.

## 2018-04-19 NOTE — Telephone Encounter (Signed)
Patient stated she woke up this morning with sharp chest pain and burning sensation and palpitations. She has been experiencing ongoing intermittent palpitiations for a few days. Patient has a history of GERD. She took protonix 20 mg this am and the symptoms have improved. She has a

## 2018-04-19 NOTE — Telephone Encounter (Signed)
New Message   Patient c/o Palpitations:  High priority if patient c/o lightheadedness, shortness of breath, or chest pain  1) How long have you had palpitations/irregular HR/ Afib? Are you having the symptoms now? palpitations on and off since last week  2) Are you currently experiencing lightheadedness, SOB or CP? A little chest pain and SOB sometimes  3) Do you have a history of afib (atrial fibrillation) or irregular heart rhythm? no  4) Have you checked your BP or HR? (document readings if available): no  5) Are you experiencing any other symptoms? coughing

## 2018-04-19 NOTE — Telephone Encounter (Signed)
Patient stated she woke up this morning with sharp chest pain, burning sensation and palpitations. She states she has some mild sob with palpitations that make her cough. She has been experiencing ongoing intermittent palpitiations for a few days. She states she had the same symptoms back in 2017. Patient had normal stress test 10/2016, echo 01/18 showed normal LVF with mild LAE. Patient has a history of GERD. She took protonix 20 mg this am and the symptoms have improved. She still has a mild burning sensation in her chest. She denies any radiating pain, dizziness, lightheadedness or syncope. BP this AM 121/65 HR 67.  Patient states she does not drink coffee and monitors her caffeine intake. Informed patient I would forward to Dr. Mayford Knife for additional recommendations. She verbalized understanding and thankful for the call

## 2018-04-21 NOTE — Telephone Encounter (Signed)
Vanessa Stanford, RN        FYI   Called pt to set up appt for event monitor. Pt stated that she did not want to "do that again" and declined appointment.     Patient refused, will close encounter

## 2018-05-17 DIAGNOSIS — Z79890 Hormone replacement therapy (postmenopausal): Secondary | ICD-10-CM | POA: Diagnosis not present

## 2018-08-10 DIAGNOSIS — R002 Palpitations: Secondary | ICD-10-CM | POA: Diagnosis not present

## 2018-08-10 DIAGNOSIS — K219 Gastro-esophageal reflux disease without esophagitis: Secondary | ICD-10-CM | POA: Diagnosis not present

## 2018-08-10 DIAGNOSIS — R7309 Other abnormal glucose: Secondary | ICD-10-CM | POA: Diagnosis not present

## 2018-08-15 ENCOUNTER — Ambulatory Visit
Admission: RE | Admit: 2018-08-15 | Discharge: 2018-08-15 | Disposition: A | Discharge status: Home / Self Care | Payer: BLUE CROSS/BLUE SHIELD | Source: Ambulatory Visit | Attending: Gynecology | Admitting: Gynecology

## 2018-08-15 DIAGNOSIS — Z1231 Encounter for screening mammogram for malignant neoplasm of breast: Secondary | ICD-10-CM

## 2018-09-06 DIAGNOSIS — Z79890 Hormone replacement therapy (postmenopausal): Secondary | ICD-10-CM | POA: Diagnosis not present

## 2018-09-12 DIAGNOSIS — I493 Ventricular premature depolarization: Secondary | ICD-10-CM | POA: Diagnosis not present

## 2018-09-12 DIAGNOSIS — R002 Palpitations: Secondary | ICD-10-CM | POA: Diagnosis not present

## 2018-09-19 DIAGNOSIS — Z136 Encounter for screening for cardiovascular disorders: Secondary | ICD-10-CM | POA: Diagnosis not present

## 2018-09-19 DIAGNOSIS — Z131 Encounter for screening for diabetes mellitus: Secondary | ICD-10-CM | POA: Diagnosis not present

## 2018-09-19 DIAGNOSIS — Z0000 Encounter for general adult medical examination without abnormal findings: Secondary | ICD-10-CM | POA: Diagnosis not present

## 2018-09-19 DIAGNOSIS — I10 Essential (primary) hypertension: Secondary | ICD-10-CM | POA: Diagnosis not present

## 2018-12-13 ENCOUNTER — Encounter (INDEPENDENT_AMBULATORY_CARE_PROVIDER_SITE_OTHER): Payer: Self-pay

## 2018-12-26 ENCOUNTER — Encounter (INDEPENDENT_AMBULATORY_CARE_PROVIDER_SITE_OTHER): Payer: Self-pay | Admitting: Bariatrics

## 2018-12-26 ENCOUNTER — Ambulatory Visit (INDEPENDENT_AMBULATORY_CARE_PROVIDER_SITE_OTHER): Payer: 59 | Admitting: Bariatrics

## 2018-12-26 VITALS — BP 120/59 | HR 66 | Temp 97.7°F | Ht 66.0 in | Wt 239.0 lb

## 2018-12-26 DIAGNOSIS — R0602 Shortness of breath: Secondary | ICD-10-CM | POA: Diagnosis not present

## 2018-12-26 DIAGNOSIS — R5383 Other fatigue: Secondary | ICD-10-CM | POA: Diagnosis not present

## 2018-12-26 DIAGNOSIS — I10 Essential (primary) hypertension: Secondary | ICD-10-CM | POA: Diagnosis not present

## 2018-12-26 DIAGNOSIS — Z6838 Body mass index (BMI) 38.0-38.9, adult: Secondary | ICD-10-CM

## 2018-12-26 DIAGNOSIS — K219 Gastro-esophageal reflux disease without esophagitis: Secondary | ICD-10-CM | POA: Diagnosis not present

## 2018-12-26 DIAGNOSIS — O24419 Gestational diabetes mellitus in pregnancy, unspecified control: Secondary | ICD-10-CM

## 2018-12-26 DIAGNOSIS — Z1331 Encounter for screening for depression: Secondary | ICD-10-CM | POA: Diagnosis not present

## 2018-12-26 DIAGNOSIS — Z9189 Other specified personal risk factors, not elsewhere classified: Secondary | ICD-10-CM

## 2018-12-26 DIAGNOSIS — Z0289 Encounter for other administrative examinations: Secondary | ICD-10-CM

## 2018-12-27 NOTE — Progress Notes (Signed)
Office: [phone]  /  Fax: [phone]   Dear Dr. Docia Chuck,   Thank you for referring Arbadella Kimbler to our clinic. The following note includes my evaluation and treatment recommendations.  HPI:   Chief Complaint: OBESITY    [name] has been referred by [name], MD for consultation regarding her obesity and obesity related comorbidities.    [name] (MR# [id number]) is a 58 y.o. female who presents on 12/26/2018 for obesity evaluation and treatment. Current BMI is Body mass index is 38.58 kg/m.[name] [name] has been struggling with her weight for many years and has been unsuccessful in either losing weight, maintaining weight loss, or reaching her healthy weight goal.     [name] attended our information session and states she is currently in the action stage of change and ready to dedicate time achieving and maintaining a healthier weight. [name] is interested in becoming our patient and working on intensive lifestyle modifications including (but not limited to) diet, exercise and weight loss.    [name] states her family eats meals together she thinks her family will eat healthier with her  she does like to cook her desired weight loss is 69 lbs she started gaining weight after her 2nd child her heaviest weight ever was 252 lbs. she struggles with sweets and carbohydrates she does snack after dinner she skips meals frequently she is frequently drinking liquids with calories she frequently makes poor food choices she frequently eats larger portions than normal  she has binge eating behaviors she struggles with emotional eating    Fatigue [name] feels her energy is lower than it should be. This has worsened with weight gain and has not worsened recently. [name] admits to daytime somnolence and she admits to waking up still tired. Patient is at risk for obstructive sleep apnea. Patent has a history of symptoms of daytime fatigue, morning fatigue, [name], and states they are not sleeping well. Snoring is present. Apneic episodes are not present. Epworth Sleepiness Score is 6  Dyspnea on exertion [name] notes increasing shortness of breath with certain activities (stairs, hills) and seems to be worsening over time with weight gain. She notes getting out of breath sooner with activity than she used to. This has not gotten worse recently. [name] denies orthopnea.  Hypertension [name] is a 58 y.o. female with hypertension (occasional palpitations, had work up).  [name] denies chest pain. She is working weight loss to help control her blood pressure with the goal of decreasing her risk of heart attack and stroke. [name] blood pressure is well controlled.  GERD (gastroesophageal reflux disease) [name] has a diagnosis of GERD and she is currently taking Pantoprazole. She is not completely controlled.  Gestational Diabetes [name] has a history of gestational diabetes. [name] denies hyperphagia. She is attempting to work on intensive lifestyle modifications including diet, exercise, and weight loss to help control her blood glucose levels.  Depression Screen [name]'s Food and Mood (modified PHQ-9) score was  Depression screen PHQ 2/9 12/26/2018  Decreased Interest 0  Down, Depressed, [name], decreased energy 2  Change in appetite 2  Feeling bad or failure about yourself  0  Trouble concentrating 1  Moving slowly or fidgety/restless 0  Suicidal thoughts 0  PHQ-9 Score 7  Difficult doing work/chores Not difficult at all    ASSESSMENT AND PLAN:  Other fatigue - Plan: EKG 12-Lead, [name], CBC With Differential, T3, T4, free, TSH, [name], Fractures), Folate, [name], [name], antepartum, [name], [name], Insulin, [name], unspecified obesity type (HCC)  PLAN:  Fatigue [name] was informed that her fatigue may be related to obesity, [name], and in the meanwhile [name] has agreed to work on diet, exercise and weight loss to help with fatigue. Proper sleep hygiene was discussed including the need for 7-8 hours of quality sleep each night. A sleep study was ordered [facility]) based on symptoms and Epworth score.  Dyspnea on exertion [name]'s shortness of breath appears to be obesity related and exercise induced. She has agreed to work on weight loss and gradually increase exercise to treat her exercise induced shortness of breath. If [name] follows our instructions and loses weight without improvement of her shortness of breath, we will plan to refer to pulmonology. We will monitor this condition regularly. [name] agrees to this plan.  Hypertension We discussed sodium restriction, working on healthy weight loss, and a regular exercise program as the means to achieve improved blood pressure control. [name] agreed with this plan and agreed to follow up as directed. We will continue to monitor her blood pressure as well as her progress with the above lifestyle modifications. She will continue her medications as prescribed and will watch for signs of hypotension as she continues her lifestyle modifications.  GERD (gastroesophageal reflux disease) [name] will avoid triggers and she will continue to take her  medications. [name] will follow up with our clinic I 2 weeks.  Gestational Diabetes [name] has been given extensive diabetes education by myself today. We will check Hgb A1c and insulin level today and Aysa agrees to follow up at the agreed upon time.  Depression Screen Evangelina had a mildly positive depression screening. Depression is commonly associated with obesity and often results in emotional eating behaviors. We will monitor this closely and work on CBT to help improve the non-hunger eating patterns. Referral to Psychology may be required if no improvement is seen as she continues in our clinic.  Obesity Latusha is currently in the action stage of change and her goal is to continue with weight loss efforts. I recommend Jeanna begin the structured treatment plan as follows:  She has agreed to follow the Category 3 plan and no Fair Life milk for breakfast Madelline has been instructed to eventually work up to a goal of 150 minutes of combined cardio and strengthening exercise per week for weight loss and overall health benefits. We discussed the following Behavioral Modification Strategies today: increasing lean protein intake, decreasing simple carbohydrates, increasing vegetables and work on meal planning and easy cooking plans   She was informed of the importance of frequent follow up visits to maximize her success with intensive lifestyle modifications for her multiple health conditions. She was informed we would discuss her lab results at her next visit unless there is a critical issue that needs to be addressed sooner. Ruthmary  agreed to keep her next visit at the agreed upon time to discuss these results.  ALLERGIES: Allergies  Allergen Reactions  . Amoxicillin Hives and Swelling  . Shellfish Allergy Hives    Any seafood.    MEDICATIONS: Current Outpatient Medications on File Prior to Visit  Medication Sig Dispense Refill  . albuterol (PROVENTIL HFA;VENTOLIN HFA) 108 (90 Base)  MCG/ACT inhaler Inhale 2 puffs into the lungs every 6 (six) hours as needed for wheezing or shortness of breath.    . ALOE VERA JUICE PO Take by mouth. 2 oz prn    . cetirizine (ZYRTEC) 10 MG tablet Take 10 mg by mouth daily.    . Collagenase POWD by Does not apply route. 1 scoop    . metoprolol succinate (TOPROL-XL) 25 MG 24 hr tablet Take 25 mg by mouth daily.    . norethindrone-ethinyl estradiol (OVCON-35,BALZIVA,BRIELLYN) 0.4-35 MG-MCG tablet Take 1 tablet by mouth daily.    . pantoprazole (PROTONIX) 20 MG tablet Take 1 tablet (20 mg total) by mouth daily. 15 tablet 0  . Probiotic Product (PRO-BIOTIC BLEND) CAPS Take 2 capsules by mouth daily.    . sucralfate (CARAFATE) 1 g tablet Take 1 tablet (1 g total) by mouth 4 (four) times daily -  with meals and at bedtime. 30 tablet 0  . amLODipine (NORVASC) 5 MG tablet Take 5 mg by mouth daily.    . hydrochlorothiazide (HYDRODIURIL) 25 MG tablet Take 12.5 mg by mouth daily.     No current facility-administered medications on file prior to visit.     PAST MEDICAL HISTORY: Past Medical History:  Diagnosis Date  . Acid reflux   . Back pain   . Chest pain   . Fe deficiency anemia   . Frequent headaches   . GERD (gastroesophageal reflux disease)   . Hypertension   . Joint pain   . Menopausal symptoms   . Mild asthma   . Obesity   . Palpitations   . Shortness of breath   . Swelling of both lower extremities     PAST SURGICAL HISTORY: Past Surgical History:  Procedure Laterality Date  . ABDOMINAL HYSTERECTOMY  2005  . APPENDECTOMY  1984  . CERVICAL CANCER CELLS FROZEN  1991  . DILATION AND CURETTAGE OF UTERUS  1991    SOCIAL HISTORY: Social History   Tobacco Use  . Smoking status: Never Smoker  . Smokeless tobacco: Never Used  Substance Use Topics  . Alcohol use: No  . Drug use: No    FAMILY HISTORY: Family History  Problem Relation Age of Onset  . Congestive Heart Failure Mother   . Diabetes Mother   . High blood  pressure Mother   . Obesity Mother   . High blood pressure Father   . Diabetes Father   . Breast cancer Maternal Aunt   . Breast cancer Cousin     ROS: Review of Systems  Constitutional: Positive for malaise/fatigue.  HENT: Positive for congestion (nasal stuffiness), sinus pain and tinnitus.   Eyes: Positive for pain.       + Wear Glasses   Respiratory: Positive for shortness of breath (on exertion).   Cardiovascular: Positive for palpitations. Negative for chest pain and orthopnea.  Gastrointestinal: Positive for heartburn.  Skin: Positive for itching.       + Dryness  Neurological: Positive for headaches.  Endo/Heme/Allergies:       Negative for hyperphagia  Psychiatric/Behavioral: The patient has insomnia.     PHYSICAL EXAM:  Blood pressure (!) 120/59, pulse 66, temperature 97.7 F (36.5 C), temperature source Oral, height 5\' 6"  (1.676 m), weight 239 lb (108.4 kg), SpO2 97 %. Body mass index is 38.58 kg/m. Physical Exam Vitals signs reviewed.  Constitutional:      Appearance: Normal appearance. She is well-developed. She is obese.  HENT:     Head: Normocephalic and atraumatic.     Nose: Nose normal.     Mouth/Throat:     Comments: Mallampati = 3 Eyes:     General: No scleral icterus.    Extraocular Movements: Extraocular movements intact.  Neck:     Musculoskeletal: Normal range of motion and neck supple.     Thyroid: No thyromegaly.  Cardiovascular:     Rate and Rhythm: Normal rate and regular rhythm.  Pulmonary:     Effort: Pulmonary effort is normal. No respiratory distress.  Abdominal:     Palpations: Abdomen is soft.     Tenderness: There is no abdominal tenderness.  Musculoskeletal: Normal range of motion.     Comments: Range of Motion normal in all 4 extremities  Skin:    General: Skin is warm and dry.  Neurological:     Mental Status: She is alert and oriented to person, place, and time.     Coordination: Coordination normal.  Psychiatric:         Mood and Affect: Mood normal.        Behavior: Behavior normal.     RECENT LABS AND TESTS: BMET    Component Value Date/Time   NA 140 10/23/2017 2016   K 3.5 10/23/2017 2016   CL 106 10/23/2017 2016   CO2 25 10/23/2017 2016   GLUCOSE 95 10/23/2017 2016   BUN 9 10/23/2017 2016   CREATININE 0.76 10/23/2017 2016   CALCIUM 9.0 10/23/2017 2016   GFRNONAA >60 10/23/2017 2016   GFRAA >60 10/23/2017 2016   No results found for: HGBA1C No results found for: INSULIN CBC    Component Value Date/Time   WBC 7.2 10/23/2017 2016   RBC 4.46 10/23/2017 2016   HGB 13.6 10/23/2017 2016   HCT 40.5 10/23/2017 2016   PLT 278 10/23/2017 2016   MCV 90.8 10/23/2017 2016   MCH 30.5 10/23/2017 2016   MCHC 33.6 10/23/2017 2016   RDW 12.7 10/23/2017 2016   LYMPHSABS 2.3 09/19/2016 0050   MONOABS 0.5 09/19/2016 0050   EOSABS 0.2 09/19/2016 0050   BASOSABS 0.0 09/19/2016 0050   Iron/TIBC/Ferritin/ %Sat No results found for: IRON, TIBC, FERRITIN, IRONPCTSAT Lipid Panel  No results found for: CHOL, TRIG, HDL, CHOLHDL, VLDL, LDLCALC, LDLDIRECT Hepatic Function Panel  No results found for: PROT, ALBUMIN, AST, ALT, ALKPHOS, BILITOT, BILIDIR, IBILI No results found for: TSH  ECG  shows NSR with a rate of 69 EKG INDIRECT CALORIMETER done today shows a VO2 of 260 and a REE of 1809.  Her calculated basal metabolic rate is [id number] thus her basal metabolic rate is better than expected.       OBESITY BEHAVIORAL INTERVENTION VISIT  Today's visit was # 1   Starting weight: 239 lbs Starting date: 12/26/2018 Today's weight : 239 lbs Today's date: 12/26/2018 Total lbs lost to date: 0   ASK: We discussed the diagnosis of obesity with Vanessa BudFelita Tarman today and Kyarah agreed to give us permission to discuss obesity behavioral modification therapy today.  ASSESS: Manus GunningFelita has the diagnosis of obesity and her BMI today is 38.59 Snigdha is in the action stage of change  ADVISE: Shenoa was educated on the  multiple health risks of obesity as well as the benefit of weight loss to improve her health. She was advised of the need for long term treatment and the importance of lifestyle modifications to improve her current health and to decrease her risk of future health problems.  AGREE: Multiple dietary modification options and treatment options were discussed and  Jamie-Lee agreed to follow the recommendations documented in the above note.  ARRANGE: Datra was educated on the importance of frequent visits to treat obesity as outlined per CMS and USPSTF guidelines and agreed to schedule her next follow up appointment today.  Cristi Loron, am acting as Energy manager for [employer]. Manson Passey, DO  I have reviewed the above documentation for accuracy and completeness, and I agree with the above. -Corinna Capra, DO

## 2018-12-28 ENCOUNTER — Encounter (INDEPENDENT_AMBULATORY_CARE_PROVIDER_SITE_OTHER): Payer: Self-pay | Admitting: Bariatrics

## 2018-12-28 LAB — CBC WITH DIFFERENTIAL
BASOS: 1 %
Basophils Absolute: 0 10*3/uL (ref 0.0–0.2)
EOS (ABSOLUTE): 0.1 10*3/uL (ref 0.0–0.4)
EOS: 2 %
HEMATOCRIT: 41.6 % (ref 34.0–46.6)
HEMOGLOBIN: 14.3 g/dL (ref 11.1–15.9)
IMMATURE GRANS (ABS): 0 10*3/uL (ref 0.0–0.1)
IMMATURE GRANULOCYTES: 0 %
Lymphocytes Absolute: 1.9 10*3/uL (ref 0.7–3.1)
Lymphs: 31 %
MCH: 30.8 pg (ref 26.6–33.0)
MCHC: 34.4 g/dL (ref 31.5–35.7)
MCV: 90 fL (ref 79–97)
MONOCYTES: 6 %
Monocytes Absolute: 0.4 10*3/uL (ref 0.1–0.9)
Neutrophils Absolute: 3.8 10*3/uL (ref 1.4–7.0)
Neutrophils: 60 %
RBC: 4.64 x10E6/uL (ref 3.77–5.28)
RDW: 12.7 % (ref 11.7–15.4)
WBC: 6.2 10*3/uL (ref 3.4–10.8)

## 2018-12-28 LAB — COMPREHENSIVE METABOLIC PANEL
A/G RATIO: 1.6 (ref 1.2–2.2)
ALT: 19 IU/L (ref 0–32)
AST: 15 IU/L (ref 0–40)
Albumin: 4.4 g/dL (ref 3.8–4.9)
Alkaline Phosphatase: 71 IU/L (ref 39–117)
BUN/Creatinine Ratio: 15 (ref 9–23)
BUN: 12 mg/dL (ref 6–24)
Bilirubin Total: 0.7 mg/dL (ref 0.0–1.2)
CALCIUM: 8.9 mg/dL (ref 8.7–10.2)
CO2: 21 mmol/L (ref 20–29)
Chloride: 103 mmol/L (ref 96–106)
Creatinine, Ser: 0.79 mg/dL (ref 0.57–1.00)
GFR, EST AFRICAN AMERICAN: 97 mL/min/{1.73_m2} (ref >59)
GFR, EST NON AFRICAN AMERICAN: 84 mL/min/{1.73_m2} (ref >59)
GLOBULIN, TOTAL: 2.8 g/dL (ref 1.5–4.5)
Glucose: 97 mg/dL (ref 65–99)
POTASSIUM: 4.2 mmol/L (ref 3.5–5.2)
SODIUM: 143 mmol/L (ref 134–144)
TOTAL PROTEIN: 7.2 g/dL (ref 6.0–8.5)

## 2018-12-28 LAB — LIPID PANEL WITH LDL/HDL RATIO
Cholesterol, Total: 186 mg/dL (ref 100–199)
HDL: 52 mg/dL (ref >39)
LDL Calculated: 119 mg/dL — ABNORMAL HIGH (ref 0–99)
LDl/HDL Ratio: 2.3 ratio (ref 0.0–3.2)
TRIGLYCERIDES: 77 mg/dL (ref 0–149)
VLDL CHOLESTEROL CAL: 15 mg/dL (ref 5–40)

## 2018-12-28 LAB — INSULIN, RANDOM: INSULIN: 4.2 u[IU]/mL (ref 2.6–24.9)

## 2018-12-28 LAB — FOLATE: FOLATE: 18.1 ng/mL (ref >3.0)

## 2018-12-28 LAB — HEMOGLOBIN A1C
ESTIMATED AVERAGE GLUCOSE: 114 mg/dL
Hgb A1c MFr Bld: 5.6 % (ref 4.8–5.6)

## 2018-12-28 LAB — TSH: TSH: 3.7 u[IU]/mL (ref 0.450–4.500)

## 2018-12-28 LAB — T4, FREE: Free T4: 1.36 ng/dL (ref 0.82–1.77)

## 2018-12-28 LAB — T3: T3 TOTAL: 113 ng/dL (ref 71–180)

## 2018-12-28 LAB — VITAMIN B12: VITAMIN B 12: 409 pg/mL (ref 232–1245)

## 2018-12-28 LAB — VITAMIN D 25 HYDROXY (VIT D DEFICIENCY, FRACTURES): VIT D 25 HYDROXY: 22.6 ng/mL — AB (ref 30.0–100.0)

## 2019-01-09 ENCOUNTER — Encounter (INDEPENDENT_AMBULATORY_CARE_PROVIDER_SITE_OTHER): Payer: Self-pay | Admitting: Bariatrics

## 2019-01-09 ENCOUNTER — Ambulatory Visit (INDEPENDENT_AMBULATORY_CARE_PROVIDER_SITE_OTHER): Payer: 59 | Admitting: Bariatrics

## 2019-01-09 VITALS — BP 128/77 | HR 66 | Temp 98.6°F | Ht 66.0 in | Wt 237.0 lb

## 2019-01-09 DIAGNOSIS — E559 Vitamin D deficiency, unspecified: Secondary | ICD-10-CM | POA: Diagnosis not present

## 2019-01-09 DIAGNOSIS — Z9189 Other specified personal risk factors, not elsewhere classified: Secondary | ICD-10-CM

## 2019-01-09 DIAGNOSIS — Z6838 Body mass index (BMI) 38.0-38.9, adult: Secondary | ICD-10-CM | POA: Diagnosis not present

## 2019-01-09 DIAGNOSIS — I10 Essential (primary) hypertension: Secondary | ICD-10-CM

## 2019-01-09 MED ORDER — VITAMIN D (ERGOCALCIFEROL) 1.25 MG (50000 UNIT) PO CAPS: 50000.0000 [IU] | ORAL_CAPSULE | ORAL | 0 refills | Status: DC

## 2019-01-10 DIAGNOSIS — E559 Vitamin D deficiency, unspecified: Secondary | ICD-10-CM | POA: Insufficient documentation

## 2019-01-10 DIAGNOSIS — Z6838 Body mass index (BMI) 38.0-38.9, adult: Secondary | ICD-10-CM

## 2019-01-10 NOTE — Progress Notes (Signed)
Office: [phone]  /  Fax: [phone]   HPI:   Chief Complaint: OBESITY [name] is here to discuss her progress with her obesity treatment plan. She is on the Category 3 plan with no "Fair Life" milk for breakfast and she is following her eating plan approximately 90 % of the time. She states she is doing yoga and walking for 45 minutes 3 times per week. [name] says that she likes the plan, but "there is a lot of meat". She did not feel hungry and she did not have cravings. Her weight is 237 lb (107.5 kg) today and has had a weight loss of 2 pounds over a period of 2 weeks since her last visit. She has lost 2 lbs since starting treatment with [name].  Vitamin D deficiency [name] has a diagnosis of vitamin D deficiency. She is not currently taking vit D and denies nausea, vomiting or muscle weakness.  At risk for osteopenia and osteoporosis [name] is at higher risk of osteopenia and osteoporosis due to vitamin D deficiency.   Hypertension [name] is a 58 y.o. female with hypertension. She is taking Norvasc and HCTZ. [name] denies chest pain. She is working weight loss to help control her blood pressure with the goal of decreasing her risk of heart attack and stroke. [name] blood pressure is currently controlled.  ASSESSMENT AND PLAN:  Vitamin D deficiency - Plan: Vitamin D, Ergocalciferol, (DRISDOL) 1.25 MG (50000 UT) CAPS capsule  Essential hypertension  At risk for osteoporosis  Class 2 severe obesity with serious comorbidity and body mass index (BMI) of 38.0 to 38.9 in adult, unspecified obesity type (HCC)  PLAN:  Vitamin D Deficiency [name] was informed that low vitamin D levels contributes to fatigue and are associated with obesity, breast, and colon cancer. She agrees to start prescription Vit D @50 ,000 IU every week #4 with no refills and will follow up for routine testing of vitamin D, at least 2-3 times per year. She was informed of the risk of  over-replacement of vitamin D and agrees to not increase her dose unless she discusses this with [name] first. [name] agrees to follow up with our clinic in 2 weeks.  At risk for osteopenia and osteoporosis [name] was given extended  (15 minutes) osteoporosis prevention counseling today. [name] is at risk for osteopenia and osteoporosis due to her vitamin D deficiency. She was encouraged to take her vitamin D and follow her higher calcium diet and increase strengthening exercise to help strengthen her bones and decrease her risk of osteopenia and osteoporosis.  Hypertension We discussed sodium restriction, working on healthy weight loss, and a regular exercise program as the means to achieve improved blood pressure control. [name] agreed with this plan and agreed to follow up as directed. We will continue to monitor her blood pressure as well as her progress with the above lifestyle modifications. She will continue her medications as prescribed and will watch for signs of hypotension as she continues her lifestyle modifications.  Obesity [name] is currently in the action stage of change. As such, her goal is to continue with weight loss efforts She has agreed to follow the Category 3 plan [name] has been instructed to work up to a goal of 150 minutes of combined cardio and strengthening exercise per week for weight loss and overall health benefits. We discussed the following Behavioral Modification Strategies today: increase H2O intake, keeping healthy foods in the home, increasing lean protein intake, decreasing simple carbohydrates, increasing vegetables and  work on meal planning and easy cooking plans [name] will move the protein around during the day (alternate protein snacks)  [name] has agreed to follow up with our clinic in 2 weeks. She was informed of the importance of frequent follow up visits to maximize her success with intensive lifestyle modifications for her multiple health  conditions.  ALLERGIES: Allergies  Allergen Reactions  . Amoxicillin Hives and Swelling  . Shellfish Allergy Hives    Any seafood.    MEDICATIONS: Current Outpatient Medications on File Prior to Visit  Medication Sig Dispense Refill  . albuterol (PROVENTIL HFA;VENTOLIN HFA) 108 (90 Base) MCG/ACT inhaler Inhale 2 puffs into the lungs every 6 (six) hours as needed for wheezing or shortness of breath.    . ALOE VERA JUICE PO Take by mouth. 2 oz prn    . amLODipine (NORVASC) 5 MG tablet Take 5 mg by mouth daily.    . cetirizine (ZYRTEC) 10 MG tablet Take 10 mg by mouth daily.    . Collagenase POWD by Does not apply route. 1 scoop    . hydrochlorothiazide (HYDRODIURIL) 25 MG tablet Take 12.5 mg by mouth daily.    . metoprolol succinate (TOPROL-XL) 25 MG 24 hr tablet Take 25 mg by mouth daily.    . pantoprazole (PROTONIX) 20 MG tablet Take 1 tablet (20 mg total) by mouth daily. 15 tablet 0  . Probiotic Product (PRO-BIOTIC BLEND) CAPS Take 2 capsules by mouth daily.    . sucralfate (CARAFATE) 1 g tablet Take 1 tablet (1 g total) by mouth 4 (four) times daily -  with meals and at bedtime. 30 tablet 0   No current facility-administered medications on file prior to visit.     PAST MEDICAL HISTORY: Past Medical History:  Diagnosis Date  . Acid reflux   . Back pain   . Chest pain   . Fe deficiency anemia   . Frequent headaches   . GERD (gastroesophageal reflux disease)   . Hypertension   . Joint pain   . Menopausal symptoms   . Mild asthma   . Obesity   . Palpitations   . Shortness of breath   . Swelling of both lower extremities     PAST SURGICAL HISTORY: Past Surgical History:  Procedure Laterality Date  . ABDOMINAL HYSTERECTOMY  2005  . APPENDECTOMY  1984  . CERVICAL CANCER CELLS FROZEN  1991  . DILATION AND CURETTAGE OF UTERUS  1991    SOCIAL HISTORY: Social History   Tobacco Use  . Smoking status: Never Smoker  . Smokeless tobacco: Never Used  Substance Use  Topics  . Alcohol use: No  . Drug use: No    FAMILY HISTORY: Family History  Problem Relation Age of Onset  . Congestive Heart Failure Mother   . Diabetes Mother   . High blood pressure Mother   . Obesity Mother   . High blood pressure Father   . Diabetes Father   . Breast cancer Maternal Aunt   . Breast cancer Cousin     ROS: Review of Systems  Constitutional: Positive for weight loss.  Cardiovascular: Negative for chest pain.  Gastrointestinal: Negative for nausea and vomiting.  Musculoskeletal:       Negative for muscle weakness    PHYSICAL EXAM: Blood pressure 128/77, pulse 66, temperature 98.6 F (37 C), temperature source Oral, height 5\' 6"  (1.676 m), weight 237 lb (107.5 kg), SpO2 99 %. Body mass index is 38.25 kg/m. Physical Exam Vitals signs reviewed.  Constitutional:      Appearance: Normal appearance. She is well-developed. She is obese.  Cardiovascular:     Rate and Rhythm: Normal rate.  Pulmonary:     Effort: Pulmonary effort is normal.  Musculoskeletal: Normal range of motion.  Skin:    General: Skin is warm and dry.  Neurological:     Mental Status: She is alert and oriented to person, place, and time.  Psychiatric:        Mood and Affect: Mood normal.        Behavior: Behavior normal.     RECENT LABS AND TESTS: BMET    Component Value Date/Time   NA 143 12/26/2018 0922   K 4.2 12/26/2018 0922   CL 103 12/26/2018 0922   CO2 21 12/26/2018 0922   GLUCOSE 97 12/26/2018 0922   GLUCOSE 95 10/23/2017 2016   BUN 12 12/26/2018 0922   CREATININE 0.79 12/26/2018 0922   CALCIUM 8.9 12/26/2018 0922   GFRNONAA 84 12/26/2018 0922   GFRAA 97 12/26/2018 0922   Lab Results  Component Value Date   HGBA1C 5.6 12/26/2018   Lab Results  Component Value Date   INSULIN 4.2 12/26/2018   CBC    Component Value Date/Time   WBC 6.2 12/26/2018 0922   WBC 7.2 10/23/2017 2016   RBC 4.64 12/26/2018 0922   RBC 4.46 10/23/2017 2016   HGB 14.3 12/26/2018  0922   HCT 41.6 12/26/2018 0922   PLT 278 10/23/2017 2016   MCV 90 12/26/2018 0922   MCH 30.8 12/26/2018 0922   MCH 30.5 10/23/2017 2016   MCHC 34.4 12/26/2018 0922   MCHC 33.6 10/23/2017 2016   RDW 12.7 12/26/2018 0922   LYMPHSABS 1.9 12/26/2018 0922   MONOABS 0.5 09/19/2016 0050   EOSABS 0.1 12/26/2018 0922   BASOSABS 0.0 12/26/2018 0922   Iron/TIBC/Ferritin/ %Sat No results found for: IRON, TIBC, FERRITIN, IRONPCTSAT Lipid Panel     Component Value Date/Time   CHOL 186 12/26/2018 0922   TRIG 77 12/26/2018 0922   HDL 52 12/26/2018 0922   LDLCALC 119 (H) 12/26/2018 0922   Hepatic Function Panel     Component Value Date/Time   PROT 7.2 12/26/2018 0922   ALBUMIN 4.4 12/26/2018 0922   AST 15 12/26/2018 0922   ALT 19 12/26/2018 0922   ALKPHOS 71 12/26/2018 0922   BILITOT 0.7 12/26/2018 0922      Component Value Date/Time   TSH 3.700 12/26/2018 0922     Ref. Range 12/26/2018 09:22  Vitamin D, 25-Hydroxy Latest Ref Range: 30.0 - 100.0 ng/mL 22.6 (L)     OBESITY BEHAVIORAL INTERVENTION VISIT  Today's visit was # 2   Starting weight: 239 lbs Starting date: 12/26/2017 Today's weight : 237 lbs  Today's date: 01/09/2019 Total lbs lost to date: 2   ASK: We discussed the diagnosis of obesity with [name] today and [name] agreed to give [name] permission to discuss obesity behavioral modification therapy today.  ASSESS: [name] has the diagnosis of obesity and her BMI today is 38.27 Faige is in the action stage of change   ADVISE: [name] was educated on the multiple health risks of obesity as well as the benefit of weight loss to improve her health. She was advised of the need for long term treatment and the importance of lifestyle modifications to improve her current health and to decrease her risk of future health problems.  AGREE: Multiple dietary modification options and treatment options were discussed and  [name]  agreed to follow the recommendations  documented in the above note.  ARRANGE: [name] was educated on the importance of frequent visits to treat obesity as outlined per CMS and USPSTF guidelines and agreed to schedule her next follow up appointment today.  Cristi LoronI, Joanne Murray, am acting as Energy managertranscriptionist for [employer] A. Manson PasseyBrown, DO  I have reviewed the above documentation for accuracy and completeness, and I agree with the above. -Corinna CapraAngel Brown, DO

## 2019-01-23 ENCOUNTER — Ambulatory Visit (INDEPENDENT_AMBULATORY_CARE_PROVIDER_SITE_OTHER): Payer: 59 | Admitting: Family Medicine

## 2019-01-23 ENCOUNTER — Encounter (INDEPENDENT_AMBULATORY_CARE_PROVIDER_SITE_OTHER): Payer: Self-pay | Admitting: Family Medicine

## 2019-01-23 VITALS — BP 123/73 | HR 66 | Temp 98.4°F | Ht 66.0 in | Wt 238.0 lb

## 2019-01-23 DIAGNOSIS — E559 Vitamin D deficiency, unspecified: Secondary | ICD-10-CM

## 2019-01-23 DIAGNOSIS — Z6838 Body mass index (BMI) 38.0-38.9, adult: Secondary | ICD-10-CM

## 2019-01-23 NOTE — Progress Notes (Signed)
Office: [phone]  /  Fax: [phone]   HPI:   Chief Complaint: OBESITY [name] is here to discuss her progress with her obesity treatment plan. She is on the Category 3 plan and is following her eating plan approximately 90% of the time. She states she is walking and doing weights 30-45 minutes 3 times per week. [name] is not getting all of her protein in. She denies excessive hunger. Her weight is 238 lb (108 kg) today and has had a weight gain of 1 lb since her last visit. She has lost 1 lb since starting treatment with [name].  Vitamin D deficiency [name] has a diagnosis of Vitamin D deficiency and her level is not at goal. Her last Vitamin D level was reported as 22.6 on 12/26/2018.  She is currently taking prescription Vit D and denies nausea, vomiting or muscle weakness. She does report positive fatigue.  ASSESSMENT AND PLAN:  Vitamin D deficiency  Class 2 severe obesity with serious comorbidity and body mass index (BMI) of 38.0 to 38.9 in adult, unspecified obesity type (HCC)  PLAN:  Vitamin D Deficiency [name] was informed that low Vitamin D levels contributes to fatigue and are associated with obesity, breast, and colon cancer. She agrees to continue to take prescription Vit D and will follow-up for routine testing of Vitamin D, at least 2-3 times per year. She was informed of the risk of over-replacement of Vitamin D and agrees to not increase her dose unless she discusses this with [name] first. [name] agrees to follow-up with our clinic in 2 weeks.  I spent > than 50% of the 15 minute visit on counseling as documented in the note.  Obesity [name] is currently in the action stage of change. As such, her goal is to continue with weight loss efforts. She has agreed to follow the Category 3 plan. We discussed the following Behavioral Modification Strategies today: increasing lean protein intake, work on meal planning and easy cooking plans, and planning for success. [name]  will continue current exercise regimen for weight loss and overall health benefits.  [name] has agreed to follow-up with our clinic in 2 weeks. She was informed of the importance of frequent follow up visits to maximize her success with intensive lifestyle modifications for her multiple health conditions.  ALLERGIES: Allergies  Allergen Reactions  . Amoxicillin Hives and Swelling  . Shellfish Allergy Hives    Any seafood.    MEDICATIONS: Current Outpatient Medications on File Prior to Visit  Medication Sig Dispense Refill  . albuterol (PROVENTIL HFA;VENTOLIN HFA) 108 (90 Base) MCG/ACT inhaler Inhale 2 puffs into the lungs every 6 (six) hours as needed for wheezing or shortness of breath.    . ALOE VERA JUICE PO Take by mouth. 2 oz prn    . amLODipine (NORVASC) 5 MG tablet Take 5 mg by mouth daily.    . cetirizine (ZYRTEC) 10 MG tablet Take 10 mg by mouth daily.    . Collagenase POWD by Does not apply route. 1 scoop    . hydrochlorothiazide (HYDRODIURIL) 25 MG tablet Take 12.5 mg by mouth daily.    . metoprolol succinate (TOPROL-XL) 25 MG 24 hr tablet Take 25 mg by mouth daily.    . pantoprazole (PROTONIX) 20 MG tablet Take 1 tablet (20 mg total) by mouth daily. 15 tablet 0  . Probiotic Product (PRO-BIOTIC BLEND) CAPS Take 2 capsules by mouth daily.    . sucralfate (CARAFATE) 1 g tablet Take 1 tablet (1 g total)  by mouth 4 (four) times daily -  with meals and at bedtime. 30 tablet 0  . Vitamin D, Ergocalciferol, (DRISDOL) 1.25 MG (50000 UT) CAPS capsule Take 1 capsule (50,000 Units total) by mouth every 7 (seven) days. 4 capsule 0   No current facility-administered medications on file prior to visit.     PAST MEDICAL HISTORY: Past Medical History:  Diagnosis Date  . Acid reflux   . Back pain   . Chest pain   . Fe deficiency anemia   . Frequent headaches   . GERD (gastroesophageal reflux disease)   . Hypertension   . Joint pain   . Menopausal symptoms   . Mild asthma   .  Obesity   . Palpitations   . Shortness of breath   . Swelling of both lower extremities     PAST SURGICAL HISTORY: Past Surgical History:  Procedure Laterality Date  . ABDOMINAL HYSTERECTOMY  2005  . APPENDECTOMY  1984  . CERVICAL CANCER CELLS FROZEN  1991  . DILATION AND CURETTAGE OF UTERUS  1991    SOCIAL HISTORY: Social History   Tobacco Use  . Smoking status: Never Smoker  . Smokeless tobacco: Never Used  Substance Use Topics  . Alcohol use: No  . Drug use: No    FAMILY HISTORY: Family History  Problem Relation Age of Onset  . Congestive Heart Failure Mother   . Diabetes Mother   . High blood pressure Mother   . Obesity Mother   . High blood pressure Father   . Diabetes Father   . Breast cancer Maternal Aunt   . Breast cancer Cousin    ROS: Review of Systems  Constitutional: Positive for malaise/fatigue. Negative for weight loss.  Gastrointestinal: Negative for nausea and vomiting.  Musculoskeletal:       Negative for muscle weakness.   PHYSICAL EXAM: Blood pressure 123/73, pulse 66, temperature 98.4 F (36.9 C), temperature source Oral, height 5\' 6"  (1.676 m), weight 238 lb (108 kg), SpO2 98 %. Body mass index is 38.41 kg/m. Physical Exam Vitals signs reviewed.  Constitutional:      Appearance: Normal appearance. She is obese.  Cardiovascular:     Rate and Rhythm: Normal rate.     Pulses: Normal pulses.  Pulmonary:     Effort: Pulmonary effort is normal.     Breath sounds: Normal breath sounds.  Musculoskeletal: Normal range of motion.  Skin:    General: Skin is warm and dry.  Neurological:     Mental Status: She is alert and oriented to person, place, and time.  Psychiatric:        Behavior: Behavior normal.   RECENT LABS AND TESTS: BMET    Component Value Date/Time   NA 143 12/26/2018 0922   K 4.2 12/26/2018 0922   CL 103 12/26/2018 0922   CO2 21 12/26/2018 0922   GLUCOSE 97 12/26/2018 0922   GLUCOSE 95 10/23/2017 2016   BUN 12  12/26/2018 0922   CREATININE 0.79 12/26/2018 0922   CALCIUM 8.9 12/26/2018 0922   GFRNONAA 84 12/26/2018 0922   GFRAA 97 12/26/2018 0922   Lab Results  Component Value Date   HGBA1C 5.6 12/26/2018   Lab Results  Component Value Date   INSULIN 4.2 12/26/2018   CBC    Component Value Date/Time   WBC 6.2 12/26/2018 0922   WBC 7.2 10/23/2017 2016   RBC 4.64 12/26/2018 0922   RBC 4.46 10/23/2017 2016   HGB 14.3 12/26/2018 0922  HCT 41.6 12/26/2018 0922   PLT 278 10/23/2017 2016   MCV 90 12/26/2018 0922   MCH 30.8 12/26/2018 0922   MCH 30.5 10/23/2017 2016   MCHC 34.4 12/26/2018 0922   MCHC 33.6 10/23/2017 2016   RDW 12.7 12/26/2018 0922   LYMPHSABS 1.9 12/26/2018 0922   MONOABS 0.5 09/19/2016 0050   EOSABS 0.1 12/26/2018 0922   BASOSABS 0.0 12/26/2018 0922   Iron/TIBC/Ferritin/ %Sat No results found for: IRON, TIBC, FERRITIN, IRONPCTSAT Lipid Panel     Component Value Date/Time   CHOL 186 12/26/2018 0922   TRIG 77 12/26/2018 0922   HDL 52 12/26/2018 0922   LDLCALC 119 (H) 12/26/2018 0922   Hepatic Function Panel     Component Value Date/Time   PROT 7.2 12/26/2018 0922   ALBUMIN 4.4 12/26/2018 0922   AST 15 12/26/2018 0922   ALT 19 12/26/2018 0922   ALKPHOS 71 12/26/2018 0922   BILITOT 0.7 12/26/2018 0922      Component Value Date/Time   TSH 3.700 12/26/2018 0922    Ref. Range 12/26/2018 09:22  Vitamin D, 25-Hydroxy Latest Ref Range: 30.0 - 100.0 ng/mL 22.6 (L)    OBESITY BEHAVIORAL INTERVENTION VISIT  Today's visit was #3  Starting weight: 239 lbs Starting date: 12/26/2018 Today's weight: 238 lbs  Today's date: 01/23/2019 Total lbs lost to date: 1   01/23/2019  Height 5\' 6"  (1.676 m)  Weight 238 lb (108 kg)  BMI (Calculated) 38.43  BLOOD PRESSURE - SYSTOLIC 123  BLOOD PRESSURE - DIASTOLIC 73   Body Fat % 46.6 %  Total Body Water (lbs) 84.6 lbs   ASK: We discussed the diagnosis of obesity with [name] today and [name] agreed to give [name]  permission to discuss obesity behavioral modification therapy today.  ASSESS: Shaelan has the diagnosis of obesity and her BMI today is 38.43. Lovisa is in the action stage of change.   ADVISE: Ahmari was educated on the multiple health risks of obesity as well as the benefit of weight loss to improve her health. She was advised of the need for long term treatment and the importance of lifestyle modifications to improve her current health and to decrease her risk of future health problems.  AGREE: Multiple dietary modification options and treatment options were discussed and  Truda agreed to follow the recommendations documented in the above note.  ARRANGE: Saniyyah was educated on the importance of frequent visits to treat obesity as outlined per CMS and USPSTF guidelines and agreed to schedule her next follow up appointment today.  IMarianna Payment, am acting as Energy manager for [place], FNP-C.  I have reviewed the above documentation for accuracy and completeness, and I agree with the above.  - Tyron Manetta, FNP-C.

## 2019-01-24 ENCOUNTER — Encounter (INDEPENDENT_AMBULATORY_CARE_PROVIDER_SITE_OTHER): Payer: Self-pay | Admitting: Family Medicine

## 2019-02-06 ENCOUNTER — Ambulatory Visit (INDEPENDENT_AMBULATORY_CARE_PROVIDER_SITE_OTHER): Payer: 59 | Admitting: Neurology

## 2019-02-06 ENCOUNTER — Encounter: Payer: Self-pay | Admitting: Neurology

## 2019-02-06 VITALS — BP 139/82 | HR 59 | Ht 66.0 in | Wt 246.0 lb

## 2019-02-06 DIAGNOSIS — G4719 Other hypersomnia: Secondary | ICD-10-CM | POA: Diagnosis not present

## 2019-02-06 DIAGNOSIS — R0683 Snoring: Secondary | ICD-10-CM

## 2019-02-06 DIAGNOSIS — R002 Palpitations: Secondary | ICD-10-CM

## 2019-02-06 DIAGNOSIS — Z6838 Body mass index (BMI) 38.0-38.9, adult: Secondary | ICD-10-CM

## 2019-02-06 DIAGNOSIS — I10 Essential (primary) hypertension: Secondary | ICD-10-CM

## 2019-02-06 DIAGNOSIS — E559 Vitamin D deficiency, unspecified: Secondary | ICD-10-CM

## 2019-02-06 DIAGNOSIS — R079 Chest pain, unspecified: Secondary | ICD-10-CM

## 2019-02-06 NOTE — Progress Notes (Signed)
SLEEP MEDICINE CLINIC   Provider:  Melvyn Novas, MD   Primary Care Physician:  [name], MD   Referring Provider: Verdene Rio, DO.    Chief Complaint  Patient presents with  . New Patient (Initial Visit)    pt alone, her pcp states her tonsils are enlarged and the pt complains of not sleeping well. she states that she has been told she snores and she wakes up frequently during the night. she has never had a sleep study completed.     HPI:  [name] is a 58 y.o. female patient who is seen on 02-06-2019 in a referral  from Dr. Manson Passey.  [name] has been referred by her medical weight and wellness specialist, Dr. Verdene Rio.  She has struggled with obesity for much of her adult life.  Her husband has noticed that she snores and he actually recorded her and there is an apneic pause at the end of a crescendo snoring noted.  Her sleep has not been as restorative and refreshing and she reports while it is not difficult to go to sleep it can be difficult to stay asleep through the night.  There are multiple comorbidities most of them related to obesity that I would like to discuss today that all contribute to a higher risk of having sleep apnea for constitutes symptoms of sleep apnea.  The patient #1 feels fatigued and daytime sleepy.  She endorsed 9 points today on the Epworth sleepiness score, she also endorsed 21 points on the fatigue severity score.  She generally gets 5 or 6 hours of sleep during the night as recorded by her smart watch, but she is not sure about the quality of sleep she gets.  She is known to have large tonsils and a small upper airway.  She has expressed dyspnea on exertion, she has hypertension with occasional palpitations.  The cardiac work-up did not show coronary artery disease and she is currently well controlled on medication.  She takes pantoprazole for GERD which has not been completely controlled and can also mimic apneas at night she had a history  of gestational diabetes, she endorses a depression score PHQ 9 at 7 points, and she tested for vitamin D deficiency.  Dr. Manson Passey also ordered an EKG 12-lead, [name], CBC with differential, TSH T3 and 4 and free T4 hormone, folic acid, and metabolic panel. I reviewed the patient's medication list the patient is currently on metoprolol to control the palpitations, and also help with blood pressure control.  She reports that she no longer takes Norvasc or hydrochlorothiazide.  Chief complaint according to patient : " Not sleeping well, not refreshed in the morning."   Sleep habits are as follows: supper is between 6 and 8 Pm, and her bedtime is usually between 11-12 midnight.  She avoids snacks at night.  The marital bedroom is cool, quiet and dark.  She prefers sleeping on her sides, on a bunch of pillows. Every night she wakes between 3 and 4.30 AM and not for a bathroom break- she may snore herself awake. She has experienced nighttime palpitation, and wakes up from that as well as from the feeling of acid rising. She rarely has nocturia. She is back to sleep within 15 minutes, sometimes 60 minutes. She stays in bed and hopes it passes. She may read, on the phone.  She rises at 7 AM, with an alarm for back-up, on Saturday she wakes at 6 AM, Sunday at 7  AM. Normally she is free of headaches, dizziness but lately has had sinus pressure left eye pressure.    Sleep relevant medical history : No history of traumatic brain injury, traumatic neck injury, whiplash etc. ear nose and throat surgeries also not listed, the patient has still her tonsils.  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  At this time she is considered obese and borderline hypertensive in the office.   Family sleep history:  No family member with diagnosed apnea, a sisters son had apnea in childhood. Mother is a snorer, has diabetes. Father is diabetic and HTN, otherwise healthy.    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 2 grandchildren.  Non smoker, non ETOH, caffeine - none since she had palpitations. No regular exercise.    Review of Systems: Out of a complete 14 system review, the patient complains of only the following symptoms, and all other reviewed systems are negative. Fighting the weight with more energy by getting more restful sleep and less fragmented sleep is the goal. Advancing her bed-time to 11.PM regularly and avoiding screen exposure in bed is another. She is post menopausal , but reports no longer hot flushes.   Epworth score 9 , Fatigue severity score 21   , depression score n/a    Social History   Socioeconomic History  . Marital status: Married    Spouse name: [name]  . Number of children: 2  . Years of education: Not on file  . Highest education level: Not on file  Occupational History  . Occupation: HAIR STYLIST  Social Needs  . Financial resource strain: Not on file  . Food insecurity:    Worry: Not on file    Inability: Not on file  . Transportation needs:    Medical: Not on file    Non-medical: Not on file  Tobacco Use  . Smoking status: Never Smoker  . Smokeless tobacco: Never Used  Substance and Sexual Activity  . Alcohol use: No  . Drug use: No  . Sexual activity: Not on file  Lifestyle  . Physical activity:    Days per week: Not on file    Minutes per session: Not on file  . Stress: Not on file  Relationships  . Social connections:    Talks on phone: Not on file    Gets together: Not on file    Attends religious service: Not on file    Active member of club or organization: Not on file    Attends meetings of clubs or organizations: Not on file    Relationship status: Not on file  . Intimate partner violence:    Fear of current or ex partner: Not on file    Emotionally abused: Not on file    Physically abused: Not on file    Forced sexual activity: Not on file  Other Topics Concern  . Not on file    Social History Narrative  . Not on file    Family History  Problem Relation Age of Onset  . Congestive Heart Failure Mother   . Diabetes Mother   . High blood pressure Mother   . Obesity Mother   . High blood pressure Father   . Diabetes Father   . Breast cancer Maternal Aunt   . Breast cancer Cousin     Past Medical History:  Diagnosis Date  . Acid reflux   . Back pain   . Chest pain   .  Fe deficiency anemia   . Frequent headaches   . GERD (gastroesophageal reflux disease)   . Hypertension   . Joint pain   . Menopausal symptoms   . Mild asthma   . Obesity   . Palpitations   . Shortness of breath   . Swelling of both lower extremities     Past Surgical History:  Procedure Laterality Date  . ABDOMINAL HYSTERECTOMY  2005  . APPENDECTOMY  1984  . CERVICAL CANCER CELLS FROZEN  1991  . DILATION AND CURETTAGE OF UTERUS  1991    Current Outpatient Medications  Medication Sig Dispense Refill  . albuterol (PROVENTIL HFA;VENTOLIN HFA) 108 (90 Base) MCG/ACT inhaler Inhale 2 puffs into the lungs every 6 (six) hours as needed for wheezing or shortness of breath.    . ALOE VERA JUICE PO Take by mouth. 2 oz prn    . amLODipine (NORVASC) 5 MG tablet Take 5 mg by mouth daily.    . cetirizine (ZYRTEC) 10 MG tablet Take 10 mg by mouth daily.    . Collagenase POWD by Does not apply route. 1 scoop    . metoprolol succinate (TOPROL-XL) 25 MG 24 hr tablet Take 25 mg by mouth daily.    . pantoprazole (PROTONIX) 20 MG tablet Take 1 tablet (20 mg total) by mouth daily. 15 tablet 0  . Probiotic Product (PRO-BIOTIC BLEND) CAPS Take 2 capsules by mouth daily.    . sucralfate (CARAFATE) 1 g tablet Take 1 tablet (1 g total) by mouth 4 (four) times daily -  with meals and at bedtime. 30 tablet 0  . Vitamin D, Ergocalciferol, (DRISDOL) 1.25 MG (50000 UT) CAPS capsule Take 1 capsule (50,000 Units total) by mouth every 7 (seven) days. 4 capsule 0   No current facility-administered medications  for this visit.     Allergies as of 02/06/2019 - Review Complete 02/06/2019  Allergen Reaction Noted  . Amoxicillin Hives and Swelling 12/06/2011  . Shellfish allergy Hives 12/06/2011    Vitals: BP 139/82   Pulse (!) 59   Ht  (1.676 m)   Wt 246 lb (111.6 kg)   BMI 39.71 kg/m  Last Weight:  Wt Readings from Last 1 Encounters:  02/06/19 246 lb (111.6 kg)   WUJ:WJXB mass index is 39.71 kg/m.     Last Height:   Ht Readings from Last 1 Encounters:  02/06/19  (1.676 m)    Physical exam:  General: The patient is awake, alert and appears not in acute distress. The patient is well groomed. Head: Normocephalic, atraumatic. Neck is supple. Mallampati 3-4 , uvula in midline but with lateral pilars crowding, tonsils are present (Large).   neck circumference: 16. 25 . Nasal airflow - patent on both sides, Retrognathia is seen, at high grade.  Subjective pulsatile tinnitus on the right.  Cardiovascular:  Regular rate and rhythm, without  murmurs or carotid bruit, and without distended neck veins. Respiratory: Lungs are clear to auscultation. Skin:  Without evidence of edema, or rash. Trunk: BMI is 39.7 kg/m2. The patient's posture is erect.   Neurologic exam : The patient is awake and alert, oriented to place and time.  Attention span & concentration ability appears normal.  Speech is fluent,  without dysarthria, with mild dysphonia .  Mood and affect are appropriate.  Cranial nerves: Pupils are equal and briskly reactive to light. Funduscopic exam without evidence of pallor or edema.  Extraocular movements  in vertical and horizontal planes intact and without  nystagmus. Visual fields by finger perimetry are intact. Hearing to finger rub intact.  Facial sensation intact to fine touch.  Facial motor strength is symmetric and tongue and uvula move midline. Shoulder shrug was symmetrical.   Motor exam:   Normal tone, muscle bulk and symmetric strength in all  extremities.  Sensory:  Fine touch, pinprick and vibration were tested in all extremities. Proprioception tested in the upper extremities was normal.  Coordination: Rapid alternating movements were normal. Finger-to-nose maneuver normal without evidence of ataxia, dysmetria or tremor.  Gait and station: Patient walks without assistive device. Turns with 3 steps. Romberg testing is negative. She has reported dizziness with upwards gaze.   Deep tendon reflexes: in the  upper and lower extremities are symmetric and intact.    Assessment:  After physical and neurologic examination, review of laboratory studies,  Personal review of imaging studies, reports of other /same  Imaging studies, results of polysomnography and / or neurophysiology testing and pre-existing records as far as provided in visit., my assessment is:    1) Mrs. [name] is a 58 year old right-handed married African-American female patient who was referred by her weight loss specialist. The patient's husband had made a recording of her nocturnal sleep breathing and captured crescendo snoring followed by apnea and hypopnea. Risk factors are a narrow upper airway, retrognathia, and body mass index that approaches 40. Her husband has also witnessed less snoring when her weight is reduced in comparison to heavier times.  I have no suspicion that there could be anything but obstructive sleep apnea present, the patient has no risk factors for central sleep apnea she is neither anemic, nor does she have hepatic or renal dysfunction.  She was diagnosed with gestational diabetes during her pregnancy but she has not become diabetic since.   The patient was advised of the nature of the diagnosed disorder , the treatment options and the risks for general health and wellness arising from not treating the condition.   I spent more than 30 minutes of face to face time with the patient.  Greater than 50% of time was spent in counseling  and coordination of care. We have discussed the diagnosis and differential and I answered the patient's questions.    Plan:  Treatment plan and additional workup : HST -    Vanessa Pennisi, MD 02/06/2019, 1:11 PM  Certified in Neurology by ABPN Certified in Sleep Medicine by [facility] [address] [place], [place] [address]

## 2019-02-07 ENCOUNTER — Encounter (INDEPENDENT_AMBULATORY_CARE_PROVIDER_SITE_OTHER): Payer: Self-pay | Admitting: Family Medicine

## 2019-02-07 ENCOUNTER — Ambulatory Visit (INDEPENDENT_AMBULATORY_CARE_PROVIDER_SITE_OTHER): Payer: 59 | Admitting: Family Medicine

## 2019-02-07 VITALS — BP 128/77 | HR 61 | Temp 97.9°F | Ht 66.0 in | Wt 240.0 lb

## 2019-02-07 DIAGNOSIS — I10 Essential (primary) hypertension: Secondary | ICD-10-CM | POA: Diagnosis not present

## 2019-02-07 DIAGNOSIS — Z9189 Other specified personal risk factors, not elsewhere classified: Secondary | ICD-10-CM

## 2019-02-07 DIAGNOSIS — Z6838 Body mass index (BMI) 38.0-38.9, adult: Secondary | ICD-10-CM | POA: Diagnosis not present

## 2019-02-07 DIAGNOSIS — E559 Vitamin D deficiency, unspecified: Secondary | ICD-10-CM

## 2019-02-07 MED ORDER — VITAMIN D (ERGOCALCIFEROL) 1.25 MG (50000 UNIT) PO CAPS: 50000.0000 [IU] | ORAL_CAPSULE | ORAL | 0 refills | Status: DC

## 2019-02-07 NOTE — Progress Notes (Signed)
Office: [phone]  /  Fax: [phone]   HPI:   Chief Complaint: OBESITY [name] is here to discuss her progress with her obesity treatment plan. She is on the Category 3 plan and is following her eating plan approximately 80% of the time. She states she is exercising 0 minutes 0 times per week. [name] reports she is on a [employer] until March 30 and states she can only have fruits and vegetables - no dairy, meat, fish, eggs,  or grains. Her weight is 240 lb (108.9 kg) today and has had a weight gain of 2 lbs since her last visit. She has lost 0 lbs since starting treatment with [name].  Vitamin D deficiency [name] has a diagnosis of Vitamin D deficiency which is currently not at goal. Her last Vitamin D level was reported to be 22.6 on 12/26/2018. She is currently taking prescription Vit D and denies nausea, vomiting or muscle weakness.  Hypertension [name] is a 58 y.o. female with hypertension, well controlled on metoprolol and Norvasc.  Finis Bud denies chest pain or shortness of breath on exertion. She is working weight loss to help control her blood pressure with the goal of decreasing her risk of heart attack and stroke.   At risk for cardiovascular disease [name] is at a higher than average risk for cardiovascular disease due to obesity. She currently denies any chest pain.  ASSESSMENT AND PLAN:  Vitamin D deficiency - Plan: Vitamin D, Ergocalciferol, (DRISDOL) 1.25 MG (50000 UT) CAPS capsule  Essential hypertension  At risk for heart disease  Class 2 severe obesity with serious comorbidity and body mass index (BMI) of 38.0 to 38.9 in adult, unspecified obesity type (HCC)  PLAN:  Vitamin D Deficiency [name] was informed that low Vitamin D levels contributes to fatigue and are associated with obesity, breast, and colon cancer. She agrees to continue to take prescription Vit D @ 50,000 IU every week #4 with 0 refills and will follow-up for routine testing of  Vitamin D, at least 2-3 times per year. She was informed of the risk of over-replacement of Vitamin D and agrees to not increase her dose unless she discusses this with [name] first. [name] agrees to follow-up with our clinic in 4 weeks.  Hypertension We discussed sodium restriction, working on healthy weight loss, and a regular exercise program as the means to achieve improved blood pressure control. Nija agreed with this plan and agreed to follow up as directed. We will continue to monitor her blood pressure as well as her progress with the above lifestyle modifications. She will continue her medications as prescribed and will watch for signs of hypotension as she continues her lifestyle modifications.  Cardiovascular risk counseling [name] was given extended (15 minutes) coronary artery disease prevention counseling today. She is 58 y.o. female and has risk factors for heart disease including obesity. We discussed intensive lifestyle modifications today with an emphasis on specific weight loss instructions and strategies. Pt was also informed of the importance of increasing exercise and decreasing saturated fats to help prevent heart disease.  Obesity [name] is currently in the action stage of change. As such, her goal is to continue with weight loss efforts. She has agreed to switch to journaling until the end of her fast. She will keep a food journal with 1300-1400 calories and 85 grams of  protein daily. We discussed eating tofu, beans, peas, and pea protein powder in smoothies. We discussed the following Behavioral Modification Strategies today: increasing lean  protein intake and planning for success.  [name] has agreed to follow-up with our clinic in 4 weeks and will see our Registered Dietitian in 2 weeks. She was informed of the importance of frequent follow up visits to maximize her success with intensive lifestyle modifications for her multiple health conditions.  ALLERGIES: Allergies    Allergen Reactions  . Amoxicillin Hives and Swelling  . Shellfish Allergy Hives    Any seafood.    MEDICATIONS: Current Outpatient Medications on File Prior to Visit  Medication Sig Dispense Refill  . albuterol (PROVENTIL HFA;VENTOLIN HFA) 108 (90 Base) MCG/ACT inhaler Inhale 2 puffs into the lungs every 6 (six) hours as needed for wheezing or shortness of breath.    . ALOE VERA JUICE PO Take by mouth. 2 oz prn    . amLODipine (NORVASC) 5 MG tablet Take 5 mg by mouth daily.    . cetirizine (ZYRTEC) 10 MG tablet Take 10 mg by mouth daily.    . Collagenase POWD by Does not apply route. 1 scoop    . metoprolol succinate (TOPROL-XL) 25 MG 24 hr tablet Take 25 mg by mouth daily.    . pantoprazole (PROTONIX) 20 MG tablet Take 1 tablet (20 mg total) by mouth daily. 15 tablet 0  . Probiotic Product (PRO-BIOTIC BLEND) CAPS Take 2 capsules by mouth daily.    . sucralfate (CARAFATE) 1 g tablet Take 1 tablet (1 g total) by mouth 4 (four) times daily -  with meals and at bedtime. 30 tablet 0   No current facility-administered medications on file prior to visit.     PAST MEDICAL HISTORY: Past Medical History:  Diagnosis Date  . Acid reflux   . Back pain   . Chest pain   . Fe deficiency anemia   . Frequent headaches   . GERD (gastroesophageal reflux disease)   . Hypertension   . Joint pain   . Menopausal symptoms   . Mild asthma   . Obesity   . Palpitations   . Shortness of breath   . Swelling of both lower extremities     PAST SURGICAL HISTORY: Past Surgical History:  Procedure Laterality Date  . ABDOMINAL HYSTERECTOMY  2005  . APPENDECTOMY  1984  . CERVICAL CANCER CELLS FROZEN  1991  . DILATION AND CURETTAGE OF UTERUS  1991    SOCIAL HISTORY: Social History   Tobacco Use  . Smoking status: Never Smoker  . Smokeless tobacco: Never Used  Substance Use Topics  . Alcohol use: No  . Drug use: No    FAMILY HISTORY: Family History  Problem Relation Age of Onset  .  Congestive Heart Failure Mother   . Diabetes Mother   . High blood pressure Mother   . Obesity Mother   . High blood pressure Father   . Diabetes Father   . Breast cancer Maternal Aunt   . Breast cancer Cousin    ROS: Review of Systems  Constitutional: Negative for weight loss.  Respiratory: Negative for shortness of breath.   Cardiovascular: Negative for chest pain.  Gastrointestinal: Negative for nausea and vomiting.  Musculoskeletal:       Negative for muscle weakness.  Endo/Heme/Allergies:       Negative for hypoglycemia.   PHYSICAL EXAM: Blood pressure 128/77, pulse 61, temperature 97.9 F (36.6 C), height 5\' 6"  (1.676 m), weight 240 lb (108.9 kg), SpO2 99 %. Body mass index is 38.74 kg/m. Physical Exam Vitals signs reviewed.  Constitutional:  Appearance: Normal appearance. She is obese.  Cardiovascular:     Rate and Rhythm: Normal rate.     Pulses: Normal pulses.  Pulmonary:     Effort: Pulmonary effort is normal.     Breath sounds: Normal breath sounds.  Musculoskeletal: Normal range of motion.  Skin:    General: Skin is warm and dry.  Neurological:     Mental Status: She is alert and oriented to person, place, and time.  Psychiatric:        Behavior: Behavior normal.   RECENT LABS AND TESTS: BMET    Component Value Date/Time   NA 143 12/26/2018 0922   K 4.2 12/26/2018 0922   CL 103 12/26/2018 0922   CO2 21 12/26/2018 0922   GLUCOSE 97 12/26/2018 0922   GLUCOSE 95 10/23/2017 2016   BUN 12 12/26/2018 0922   CREATININE 0.79 12/26/2018 0922   CALCIUM 8.9 12/26/2018 0922   GFRNONAA 84 12/26/2018 0922   GFRAA 97 12/26/2018 0922   Lab Results  Component Value Date   HGBA1C 5.6 12/26/2018   Lab Results  Component Value Date   INSULIN 4.2 12/26/2018   CBC    Component Value Date/Time   WBC 6.2 12/26/2018 0922   WBC 7.2 10/23/2017 2016   RBC 4.64 12/26/2018 0922   RBC 4.46 10/23/2017 2016   HGB 14.3 12/26/2018 0922   HCT 41.6 12/26/2018  0922   PLT 278 10/23/2017 2016   MCV 90 12/26/2018 0922   MCH 30.8 12/26/2018 0922   MCH 30.5 10/23/2017 2016   MCHC 34.4 12/26/2018 0922   MCHC 33.6 10/23/2017 2016   RDW 12.7 12/26/2018 0922   LYMPHSABS 1.9 12/26/2018 0922   MONOABS 0.5 09/19/2016 0050   EOSABS 0.1 12/26/2018 0922   BASOSABS 0.0 12/26/2018 0922   Iron/TIBC/Ferritin/ %Sat No results found for: IRON, TIBC, FERRITIN, IRONPCTSAT Lipid Panel     Component Value Date/Time   CHOL 186 12/26/2018 0922   TRIG 77 12/26/2018 0922   HDL 52 12/26/2018 0922   LDLCALC 119 (H) 12/26/2018 0922   Hepatic Function Panel     Component Value Date/Time   PROT 7.2 12/26/2018 0922   ALBUMIN 4.4 12/26/2018 0922   AST 15 12/26/2018 0922   ALT 19 12/26/2018 0922   ALKPHOS 71 12/26/2018 0922   BILITOT 0.7 12/26/2018 0922      Component Value Date/Time   TSH 3.700 12/26/2018 0922  Results for KAEDA, GOLDBLUM (MRN [id number]) as of 02/07/2019 15:25  Ref. Range 12/26/2018 09:22  Vitamin D, 25-Hydroxy Latest Ref Range: 30.0 - 100.0 ng/mL 22.6 (L)   OBESITY BEHAVIORAL INTERVENTION VISIT  Today's visit was #4   Starting weight: 239 lbs Starting date: 12/26/2018 Today's weight: 240 lbs Today's date: 02/07/2019 Total lbs lost to date: 0    02/07/2019  Height 5\' 6"  (1.676 m)  Weight 240 lb (108.9 kg)  BMI (Calculated) 38.76  BLOOD PRESSURE - SYSTOLIC 128  BLOOD PRESSURE - DIASTOLIC 77   Body Fat % 46.6 %  Total Body Water (lbs) 88.2 lbs   ASK: We discussed the diagnosis of obesity with Finis Bud today and Zaryah agreed to give [name] permission to discuss obesity behavioral modification therapy today.  ASSESS: Sagrario has the diagnosis of obesity and her BMI today is 38.76. Jenica is in the action stage of change.   ADVISE: Aretta was educated on the multiple health risks of obesity as well as the benefit of weight loss to improve her health. She was  advised of the need for long term treatment and the importance of lifestyle  modifications to improve her current health and to decrease her risk of future health problems.  AGREE: Multiple dietary modification options and treatment options were discussed and  Lorely agreed to follow the recommendations documented in the above note.  ARRANGE: Reece was educated on the importance of frequent visits to treat obesity as outlined per CMS and USPSTF guidelines and agreed to schedule her next follow up appointment today.  IMarianna Payment, am acting as Energy manager for [place], FNP-C.  I have reviewed the above documentation for accuracy and completeness, and I agree with the above.  - Dawn Whitmire, FNP-C.

## 2019-02-08 ENCOUNTER — Encounter (INDEPENDENT_AMBULATORY_CARE_PROVIDER_SITE_OTHER): Payer: Self-pay | Admitting: Family Medicine

## 2019-02-20 ENCOUNTER — Ambulatory Visit (INDEPENDENT_AMBULATORY_CARE_PROVIDER_SITE_OTHER): Payer: 59 | Admitting: Dietician

## 2019-02-22 ENCOUNTER — Encounter (INDEPENDENT_AMBULATORY_CARE_PROVIDER_SITE_OTHER): Payer: Self-pay

## 2019-03-06 ENCOUNTER — Encounter (INDEPENDENT_AMBULATORY_CARE_PROVIDER_SITE_OTHER): Payer: Self-pay | Admitting: Family Medicine

## 2019-03-06 ENCOUNTER — Ambulatory Visit (INDEPENDENT_AMBULATORY_CARE_PROVIDER_SITE_OTHER): Payer: 59 | Admitting: Family Medicine

## 2019-03-06 ENCOUNTER — Other Ambulatory Visit: Payer: Self-pay

## 2019-03-06 DIAGNOSIS — Z6838 Body mass index (BMI) 38.0-38.9, adult: Secondary | ICD-10-CM | POA: Diagnosis not present

## 2019-03-06 DIAGNOSIS — E559 Vitamin D deficiency, unspecified: Secondary | ICD-10-CM

## 2019-03-06 DIAGNOSIS — G4719 Other hypersomnia: Secondary | ICD-10-CM

## 2019-03-06 MED ORDER — VITAMIN D (ERGOCALCIFEROL) 1.25 MG (50000 UNIT) PO CAPS: 50000.0000 [IU] | ORAL_CAPSULE | ORAL | 0 refills | Status: DC

## 2019-03-06 NOTE — Progress Notes (Signed)
Office: [phone]  /  Fax: [phone] TeleHealth Visit:  [name] has verbally consented to this TeleHealth visit today. The patient is located at home, the provider is located at the [facility] office. The participants in this visit include the listed provider and patient. The visit was conducted today via FaceTime.  HPI:   Chief Complaint: OBESITY [name] is here to discuss her progress with her obesity treatment plan. She is keeping a food journal with 1300-1400 calories and 85 grams of protein daily and is following her eating plan approximately 20% of the time. She states she is walking 2 miles 5 times per week. [name] reports losing 15 lbs recently due to being on a Daniel fast in February. She is now done with the fast.  She states she does not like journaling but wants to continue because she does not like some of the food on the category plan. She wants to try to eat less"heavy" meats such as beef and chicken. She wants to use pea protein to help get her protein in. She states she will eat [place], salmon, and yogurt. She denies polyphagia. We were unable to weigh the patient today for this TeleHealth visit. She feels as if she has lost 15 lbs since her last visit. She has lost 0 lbs since starting treatment with [name].  Vitamin D deficiency [name] has a diagnosis of Vitamin D deficiency, which is not at goal. Her last Vitamin D level was reported at 22.6 on 12/26/2018. She is currently taking prescription Vit D and denies nausea, vomiting or muscle weakness.  Excessive Daytime Sleepiness Bertha reports seeing Dr. Vickey Huger for a consult. She states a sleep study was ordered but is on hold for now.  ASSESSMENT AND PLAN:  Vitamin D deficiency - Plan: Vitamin D, Ergocalciferol, (DRISDOL) 1.25 MG (50000 UT) CAPS capsule  Excessive daytime sleepiness  Class 2 severe obesity with serious comorbidity and body mass index (BMI) of 38.0 to 38.9 in adult, unspecified  obesity type (HCC)  PLAN:  Vitamin D Deficiency [name] was informed that low Vitamin D levels contributes to fatigue and are associated with obesity, breast, and colon cancer. She agrees to continue to take prescription Vit D @ 50,000 IU every week #4 with 0 refills and will follow-up for routine testing of Vitamin D, at least 2-3 times per year. She was informed of the risk of over-replacement of Vitamin D and agrees to not increase her dose unless she discusses this with [name] first. [name] agrees to follow-up with our clinic in 2 weeks.  Excessive Daytime Sleepiness [name] will follow-up with Neurology as directed.  Obesity [name] is currently in the action stage of change. As such, her goal is to continue with weight loss efforts. She has agreed to keep a food journal with 1300-1400 calories and 85 grams of protein daily. [name] has been instructed to continue walking 2 miles 5 times per week. We discussed the following Behavioral Modification Strategies today: increasing lean protein intake and planning for success. I encouraged her to get her protein from food rather than pea protein powder.   [name] has agreed to follow-up with our clinic in 2 weeks. She was informed of the importance of frequent follow-up visits to maximize her success with intensive lifestyle modifications for her multiple health conditions.  ALLERGIES: Allergies  Allergen Reactions   Amoxicillin Hives and Swelling   Shellfish Allergy Hives    Any seafood.    MEDICATIONS: Current Outpatient Medications on File  Prior to Visit  Medication Sig Dispense Refill   albuterol (PROVENTIL HFA;VENTOLIN HFA) 108 (90 Base) MCG/ACT inhaler Inhale 2 puffs into the lungs every 6 (six) hours as needed for wheezing or shortness of breath.     ALOE VERA JUICE PO Take by mouth. 2 oz prn     amLODipine (NORVASC) 5 MG tablet Take 5 mg by mouth daily.     cetirizine (ZYRTEC) 10 MG tablet Take 10 mg by mouth daily.      Collagenase POWD by Does not apply route. 1 scoop     metoprolol succinate (TOPROL-XL) 25 MG 24 hr tablet Take 25 mg by mouth daily.     pantoprazole (PROTONIX) 20 MG tablet Take 1 tablet (20 mg total) by mouth daily. 15 tablet 0   Probiotic Product (PRO-BIOTIC BLEND) CAPS Take 2 capsules by mouth daily.     sucralfate (CARAFATE) 1 g tablet Take 1 tablet (1 g total) by mouth 4 (four) times daily -  with meals and at bedtime. 30 tablet 0   No current facility-administered medications on file prior to visit.     PAST MEDICAL HISTORY: Past Medical History:  Diagnosis Date   Acid reflux    Back pain    Chest pain    Fe deficiency anemia    Frequent headaches    GERD (gastroesophageal reflux disease)    Hypertension    Joint pain    Menopausal symptoms    Mild asthma    Obesity    Palpitations    Shortness of breath    Swelling of both lower extremities     PAST SURGICAL HISTORY: Past Surgical History:  Procedure Laterality Date   ABDOMINAL HYSTERECTOMY  2005   APPENDECTOMY  1984   CERVICAL CANCER CELLS FROZEN  1991   DILATION AND CURETTAGE OF UTERUS  1991    SOCIAL HISTORY: Social History   Tobacco Use   Smoking status: Never Smoker   Smokeless tobacco: Never Used  Substance Use Topics   Alcohol use: No   Drug use: No    FAMILY HISTORY: Family History  Problem Relation Age of Onset   Congestive Heart Failure Mother    Diabetes Mother    High blood pressure Mother    Obesity Mother    High blood pressure Father    Diabetes Father    Breast cancer Maternal Aunt    Breast cancer Cousin    ROS: Review of Systems  Gastrointestinal: Negative for nausea and vomiting.  Musculoskeletal:       Negative for muscle weakness.  Neurological:       Positive for excessive daytime sleepiness.   PHYSICAL EXAM: Pt in no acute distress  RECENT LABS AND TESTS: BMET    Component Value Date/Time   NA 143 12/26/2018 0922   K 4.2  12/26/2018 0922   CL 103 12/26/2018 0922   CO2 21 12/26/2018 0922   GLUCOSE 97 12/26/2018 0922   GLUCOSE 95 10/23/2017 2016   BUN 12 12/26/2018 0922   CREATININE 0.79 12/26/2018 0922   CALCIUM 8.9 12/26/2018 0922   GFRNONAA 84 12/26/2018 0922   GFRAA 97 12/26/2018 0922   Lab Results  Component Value Date   HGBA1C 5.6 12/26/2018   Lab Results  Component Value Date   INSULIN 4.2 12/26/2018   CBC    Component Value Date/Time   WBC 6.2 12/26/2018 0922   WBC 7.2 10/23/2017 2016   RBC 4.64 12/26/2018 0922   RBC 4.46 10/23/2017  2016   HGB 14.3 12/26/2018 0922   HCT 41.6 12/26/2018 0922   PLT 278 10/23/2017 2016   MCV 90 12/26/2018 0922   MCH 30.8 12/26/2018 0922   MCH 30.5 10/23/2017 2016   MCHC 34.4 12/26/2018 0922   MCHC 33.6 10/23/2017 2016   RDW 12.7 12/26/2018 0922   LYMPHSABS 1.9 12/26/2018 0922   MONOABS 0.5 09/19/2016 0050   EOSABS 0.1 12/26/2018 0922   BASOSABS 0.0 12/26/2018 0922   Iron/TIBC/Ferritin/ %Sat No results found for: IRON, TIBC, FERRITIN, IRONPCTSAT Lipid Panel     Component Value Date/Time   CHOL 186 12/26/2018 0922   TRIG 77 12/26/2018 0922   HDL 52 12/26/2018 0922   LDLCALC 119 (H) 12/26/2018 0922   Hepatic Function Panel     Component Value Date/Time   PROT 7.2 12/26/2018 0922   ALBUMIN 4.4 12/26/2018 0922   AST 15 12/26/2018 0922   ALT 19 12/26/2018 0922   ALKPHOS 71 12/26/2018 0922   BILITOT 0.7 12/26/2018 0922      Component Value Date/Time   TSH 3.700 12/26/2018 0922   Results for SHEMECA, WEHRLI (MRN [id number]) as of 03/06/2019 16:07  Ref. Range 12/26/2018 09:22  Vitamin D, 25-Hydroxy Latest Ref Range: 30.0 - 100.0 ng/mL 22.6 (L)   I, Marianna Payment, am acting as Energy manager for [place], FNP-C.  I have reviewed the above documentation for accuracy and completeness, and I agree with the above.  - Kemya Shed, FNP-C.

## 2019-03-07 ENCOUNTER — Encounter (INDEPENDENT_AMBULATORY_CARE_PROVIDER_SITE_OTHER): Payer: Self-pay | Admitting: Family Medicine

## 2019-03-20 ENCOUNTER — Encounter (INDEPENDENT_AMBULATORY_CARE_PROVIDER_SITE_OTHER): Payer: Self-pay | Admitting: Family Medicine

## 2019-03-20 ENCOUNTER — Ambulatory Visit (INDEPENDENT_AMBULATORY_CARE_PROVIDER_SITE_OTHER): Payer: 59 | Admitting: Family Medicine

## 2019-03-20 ENCOUNTER — Other Ambulatory Visit: Payer: Self-pay

## 2019-03-20 DIAGNOSIS — Z6838 Body mass index (BMI) 38.0-38.9, adult: Secondary | ICD-10-CM | POA: Diagnosis not present

## 2019-03-20 DIAGNOSIS — E559 Vitamin D deficiency, unspecified: Secondary | ICD-10-CM

## 2019-03-20 NOTE — Progress Notes (Signed)
Office: [phone]  /  Fax: [phone] TeleHealth Visit:  [name] has verbally consented to this TeleHealth visit today. The patient is located at home, the provider is located at the [facility] office. The participants in this visit include the listed provider and patient. The visit was conducted today via FaceTime.  HPI:   Chief Complaint: OBESITY [name] is here to discuss her progress with her obesity treatment plan. She is on the Category 2 plan and is following her eating plan approximately 85% of the time. She states she is walking 60 minutes 7 times per week. [name] is journaling 4 days a week. She feels she has gained 3 lbs since she stopped the [employer]. She states she weighed 230 lbs today. She states she is unsure if she is getting her protein and is unsure how to monitor protein intake on My Fitness Pal. We were unable to weigh the patient today for this TeleHealth visit. She feels as if she has maintained her weight since her last visit. She has lost 0 lbs since starting treatment with [name].  Vitamin D deficiency [name] has a diagnosis of Vitamin D deficiency, which is not at goal. Her last Vitamin D level was reported to be 22.6 on 12/26/2018. She is currently taking prescription Vit D and denies nausea, vomiting or muscle weakness.  ASSESSMENT AND PLAN:  Vitamin D deficiency  Class 2 severe obesity with serious comorbidity and body mass index (BMI) of 38.0 to 38.9 in adult, unspecified obesity type (HCC)  PLAN:  Vitamin D Deficiency [name] was informed that low Vitamin D levels contributes to fatigue and are associated with obesity, breast, and colon cancer. She agrees to continue taking prescription Vit D (no RX needed) and will follow-up for routine testing of Vitamin D in 4-6 weeks. She was informed of the risk of over-replacement of Vitamin D and agrees to not increase her dose unless she discusses this with [name] first. [name] agrees to follow-up  with our clinic in 2 weeks.  I spent > than 50% of the 15 minute visit on counseling as documented in the note.  Obesity [name] is currently in the action stage of change. As such, her goal is to continue with weight loss efforts. She has agreed to keep a food journal with 1300-1400 calories and 85 grams of protein daily. She will continue to journal 4-5 days per week. I demonstrated for her how to monitor her protein intake and we sent her a MyChart message with the instructions. [name] has been instructed to continue her current exercise regimen for weight loss and overall health benefits. We discussed the following Behavioral Modification Strategies today: increasing lean protein intake, planning for success, and keep a strict food journal.  [name] has agreed to follow-up with our clinic in 2 weeks. She was informed of the importance of frequent follow-up visits to maximize her success with intensive lifestyle modifications for her multiple health conditions.  ALLERGIES: Allergies  Allergen Reactions   Amoxicillin Hives and Swelling   Shellfish Allergy Hives    Any seafood.    MEDICATIONS: Current Outpatient Medications on File Prior to Visit  Medication Sig Dispense Refill   albuterol (PROVENTIL HFA;VENTOLIN HFA) 108 (90 Base) MCG/ACT inhaler Inhale 2 puffs into the lungs every 6 (six) hours as needed for wheezing or shortness of breath.     ALOE VERA JUICE PO Take by mouth. 2 oz prn     amLODipine (NORVASC) 5 MG tablet Take 5  mg by mouth daily.     cetirizine (ZYRTEC) 10 MG tablet Take 10 mg by mouth daily.     Collagenase POWD by Does not apply route. 1 scoop     metoprolol succinate (TOPROL-XL) 25 MG 24 hr tablet Take 25 mg by mouth daily.     pantoprazole (PROTONIX) 20 MG tablet Take 1 tablet (20 mg total) by mouth daily. 15 tablet 0   Probiotic Product (PRO-BIOTIC BLEND) CAPS Take 2 capsules by mouth daily.     sucralfate (CARAFATE) 1 g tablet Take 1 tablet (1 g  total) by mouth 4 (four) times daily -  with meals and at bedtime. 30 tablet 0   Vitamin D, Ergocalciferol, (DRISDOL) 1.25 MG (50000 UT) CAPS capsule Take 1 capsule (50,000 Units total) by mouth every 7 (seven) days. 4 capsule 0   No current facility-administered medications on file prior to visit.     PAST MEDICAL HISTORY: Past Medical History:  Diagnosis Date   Acid reflux    Back pain    Chest pain    Fe deficiency anemia    Frequent headaches    GERD (gastroesophageal reflux disease)    Hypertension    Joint pain    Menopausal symptoms    Mild asthma    Obesity    Palpitations    Shortness of breath    Swelling of both lower extremities     PAST SURGICAL HISTORY: Past Surgical History:  Procedure Laterality Date   ABDOMINAL HYSTERECTOMY  2005   APPENDECTOMY  1984   CERVICAL CANCER CELLS FROZEN  1991   DILATION AND CURETTAGE OF UTERUS  1991    SOCIAL HISTORY: Social History   Tobacco Use   Smoking status: Never Smoker   Smokeless tobacco: Never Used  Substance Use Topics   Alcohol use: No   Drug use: No    FAMILY HISTORY: Family History  Problem Relation Age of Onset   Congestive Heart Failure Mother    Diabetes Mother    High blood pressure Mother    Obesity Mother    High blood pressure Father    Diabetes Father    Breast cancer Maternal Aunt    Breast cancer Cousin    ROS: Review of Systems  Gastrointestinal: Negative for nausea and vomiting.  Musculoskeletal:       Negative for muscle weakness.   PHYSICAL EXAM: Pt in no acute distress  RECENT LABS AND TESTS: BMET    Component Value Date/Time   NA 143 12/26/2018 0922   K 4.2 12/26/2018 0922   CL 103 12/26/2018 0922   CO2 21 12/26/2018 0922   GLUCOSE 97 12/26/2018 0922   GLUCOSE 95 10/23/2017 2016   BUN 12 12/26/2018 0922   CREATININE 0.79 12/26/2018 0922   CALCIUM 8.9 12/26/2018 0922   GFRNONAA 84 12/26/2018 0922   GFRAA 97 12/26/2018 0922   Lab  Results  Component Value Date   HGBA1C 5.6 12/26/2018   Lab Results  Component Value Date   INSULIN 4.2 12/26/2018   CBC    Component Value Date/Time   WBC 6.2 12/26/2018 0922   WBC 7.2 10/23/2017 2016   RBC 4.64 12/26/2018 0922   RBC 4.46 10/23/2017 2016   HGB 14.3 12/26/2018 0922   HCT 41.6 12/26/2018 0922   PLT 278 10/23/2017 2016   MCV 90 12/26/2018 0922   MCH 30.8 12/26/2018 0922   MCH 30.5 10/23/2017 2016   MCHC 34.4 12/26/2018 0922   MCHC 33.6 10/23/2017  2016   RDW 12.7 12/26/2018 0922   LYMPHSABS 1.9 12/26/2018 0922   MONOABS 0.5 09/19/2016 0050   EOSABS 0.1 12/26/2018 0922   BASOSABS 0.0 12/26/2018 0922   Iron/TIBC/Ferritin/ %Sat No results found for: IRON, TIBC, FERRITIN, IRONPCTSAT Lipid Panel     Component Value Date/Time   CHOL 186 12/26/2018 0922   TRIG 77 12/26/2018 0922   HDL 52 12/26/2018 0922   LDLCALC 119 (H) 12/26/2018 0922   Hepatic Function Panel     Component Value Date/Time   PROT 7.2 12/26/2018 0922   ALBUMIN 4.4 12/26/2018 0922   AST 15 12/26/2018 0922   ALT 19 12/26/2018 0922   ALKPHOS 71 12/26/2018 0922   BILITOT 0.7 12/26/2018 0922      Component Value Date/Time   TSH 3.700 12/26/2018 0922   Results for Finis BudBRIGGS, Aminta (MRN [id number]) as of 03/20/2019 16:54  Ref. Range 12/26/2018 09:22  Vitamin D, 25-Hydroxy Latest Ref Range: 30.0 - 100.0 ng/mL 22.6 (L)   I, Marianna Paymentenise Haag, am acting as Energy managertranscriptionist for [place] Algie Cales, FNP-C.  I have reviewed the above documentation for accuracy and completeness, and I agree with the above.  - Zaahir Pickney, FNP-C.

## 2019-03-21 ENCOUNTER — Encounter (INDEPENDENT_AMBULATORY_CARE_PROVIDER_SITE_OTHER): Payer: Self-pay | Admitting: Family Medicine

## 2019-04-03 ENCOUNTER — Ambulatory Visit (INDEPENDENT_AMBULATORY_CARE_PROVIDER_SITE_OTHER): Payer: 59 | Admitting: Family Medicine

## 2019-04-14 ENCOUNTER — Encounter (INDEPENDENT_AMBULATORY_CARE_PROVIDER_SITE_OTHER): Payer: Self-pay | Admitting: Family Medicine

## 2019-04-17 ENCOUNTER — Ambulatory Visit (INDEPENDENT_AMBULATORY_CARE_PROVIDER_SITE_OTHER): Payer: 59 | Admitting: Family Medicine

## 2019-04-17 ENCOUNTER — Other Ambulatory Visit: Payer: Self-pay

## 2019-04-17 DIAGNOSIS — E559 Vitamin D deficiency, unspecified: Secondary | ICD-10-CM | POA: Diagnosis not present

## 2019-04-17 DIAGNOSIS — I10 Essential (primary) hypertension: Secondary | ICD-10-CM

## 2019-04-17 DIAGNOSIS — Z6838 Body mass index (BMI) 38.0-38.9, adult: Secondary | ICD-10-CM

## 2019-04-17 MED ORDER — VITAMIN D (ERGOCALCIFEROL) 1.25 MG (50000 UNIT) PO CAPS: 50000.0000 [IU] | ORAL_CAPSULE | ORAL | 0 refills | Status: DC

## 2019-04-17 NOTE — Progress Notes (Signed)
Office: [phone]  /  Fax: [phone] TeleHealth Visit:  [name] has verbally consented to this TeleHealth visit today. The patient is located at home, the provider is located at the [facility] office. The participants in this visit include the listed provider and patient. The visit was conducted today via FaceTime.  HPI:   Chief Complaint: OBESITY [name] is here to discuss her progress with her obesity treatment plan. She is keeping a food journal with 1300-1400 calories and 85 grams of protein daily and is following her eating plan approximately 85% of the time. She states she is walking 45-60 minutes 6 times per week. [name] states she is eating mostly vegetarian and journaling only 2 days per week. She reports weighing 225 lbs today, reflecting a 5 lb weight loss since her last office visit. She is snacking on fruits and nuts. We were unable to weigh the patient today for this TeleHealth visit. She feels as if she has lost 3 lbs since her last visit. She has lost 0 lbs since starting treatment with [name].  Vitamin D deficiency [name] has a diagnosis of Vitamin D deficiency, which is not at goal. Her last Vitamin D level was reported to be 22.6 on 12/26/2018. She is currently taking prescription Vit D and denies nausea, vomiting or muscle weakness.  Hypertension [name] is a 58 y.o. female with hypertension, well controlled on Norvasc and metoprolol.  [name] denies chest pain or shortness of breath on exertion. She is working weight loss to help control her blood pressure with the goal of decreasing her risk of heart attack and stroke. [name]'s last home blood pressure was 123/77.  ASSESSMENT AND PLAN:  Vitamin D deficiency - Plan: Vitamin D, Ergocalciferol, (DRISDOL) 1.25 MG (50000 UT) CAPS capsule  Essential hypertension  Class 2 severe obesity with serious comorbidity and body mass index (BMI) of 38.0 to 38.9 in adult, unspecified obesity type  (HCC)  PLAN:  Vitamin D Deficiency [name] was informed that low Vitamin D levels contributes to fatigue and are associated with obesity, breast, and colon cancer. She agrees to continue to take prescription Vit D @ 50,000 IU every week #4 with 0 refills and will follow-up for routine testing of Vitamin D, at least 2-3 times per year. She was informed of the risk of over-replacement of Vitamin D and agrees to not increase her dose unless she discusses this with [name] first. [name] agrees to follow-up with our clinic in 2-3 weeks.  Hypertension We discussed sodium restriction, working on healthy weight loss, and a regular exercise program as the means to achieve improved blood pressure control. [name] agreed with this plan and agreed to follow-up as directed. We will continue to monitor her blood pressure as well as her progress with the above lifestyle modifications. [name] will continue Norvasc and metoprolol and will watch for signs of hypotension as she continues her lifestyle modifications.  Obesity [name] is currently in the action stage of change. As such, her goal is to continue with weight loss efforts. She has agreed to keep a food journal with 1300-1400 calories and 85 grams of protein daily. [name] will journal 5 days per week. We discussed protein rich foods. [name] has been instructed to continue her current exercise regimen for weight loss and overall health benefits. We discussed the following Behavioral Modification Strategies today: increasing lean protein intake and planning for success.  [name] has agreed to follow-up with our clinic in 2-3 weeks. She was informed  of the importance of frequent follow-up visits to maximize her success with intensive lifestyle modifications for her multiple health conditions.  ALLERGIES: Allergies  Allergen Reactions  . Amoxicillin Hives and Swelling  . Shellfish Allergy Hives    Any seafood.    MEDICATIONS: Current Outpatient Medications on  File Prior to Visit  Medication Sig Dispense Refill  . albuterol (PROVENTIL HFA;VENTOLIN HFA) 108 (90 Base) MCG/ACT inhaler Inhale 2 puffs into the lungs every 6 (six) hours as needed for wheezing or shortness of breath.    . ALOE VERA JUICE PO Take by mouth. 2 oz prn    . amLODipine (NORVASC) 5 MG tablet Take 5 mg by mouth daily.    . cetirizine (ZYRTEC) 10 MG tablet Take 10 mg by mouth daily.    . Collagenase POWD by Does not apply route. 1 scoop    . metoprolol succinate (TOPROL-XL) 25 MG 24 hr tablet Take 25 mg by mouth daily.    . pantoprazole (PROTONIX) 20 MG tablet Take 1 tablet (20 mg total) by mouth daily. 15 tablet 0  . Probiotic Product (PRO-BIOTIC BLEND) CAPS Take 2 capsules by mouth daily.    . sucralfate (CARAFATE) 1 g tablet Take 1 tablet (1 g total) by mouth 4 (four) times daily -  with meals and at bedtime. 30 tablet 0   No current facility-administered medications on file prior to visit.     PAST MEDICAL HISTORY: Past Medical History:  Diagnosis Date  . Acid reflux   . Back pain   . Chest pain   . Fe deficiency anemia   . Frequent headaches   . GERD (gastroesophageal reflux disease)   . Hypertension   . Joint pain   . Menopausal symptoms   . Mild asthma   . Obesity   . Palpitations   . Shortness of breath   . Swelling of both lower extremities     PAST SURGICAL HISTORY: Past Surgical History:  Procedure Laterality Date  . ABDOMINAL HYSTERECTOMY  2005  . APPENDECTOMY  1984  . CERVICAL CANCER CELLS FROZEN  1991  . DILATION AND CURETTAGE OF UTERUS  1991    SOCIAL HISTORY: Social History   Tobacco Use  . Smoking status: Never Smoker  . Smokeless tobacco: Never Used  Substance Use Topics  . Alcohol use: No  . Drug use: No    FAMILY HISTORY: Family History  Problem Relation Age of Onset  . Congestive Heart Failure Mother   . Diabetes Mother   . High blood pressure Mother   . Obesity Mother   . High blood pressure Father   . Diabetes Father    . Breast cancer Maternal Aunt   . Breast cancer Cousin    ROS: Review of Systems  Respiratory: Negative for shortness of breath.   Cardiovascular: Negative for chest pain.  Gastrointestinal: Negative for nausea and vomiting.  Musculoskeletal:       Negative for muscle weakness.   PHYSICAL EXAM: Pt in no acute distress  RECENT LABS AND TESTS: BMET    Component Value Date/Time   NA 143 12/26/2018 0922   K 4.2 12/26/2018 0922   CL 103 12/26/2018 0922   CO2 21 12/26/2018 0922   GLUCOSE 97 12/26/2018 0922   GLUCOSE 95 10/23/2017 2016   BUN 12 12/26/2018 0922   CREATININE 0.79 12/26/2018 0922   CALCIUM 8.9 12/26/2018 0922   GFRNONAA 84 12/26/2018 0922   GFRAA 97 12/26/2018 0922   Lab Results  Component  Value Date   HGBA1C 5.6 12/26/2018   Lab Results  Component Value Date   INSULIN 4.2 12/26/2018   CBC    Component Value Date/Time   WBC 6.2 12/26/2018 0922   WBC 7.2 10/23/2017 2016   RBC 4.64 12/26/2018 0922   RBC 4.46 10/23/2017 2016   HGB 14.3 12/26/2018 0922   HCT 41.6 12/26/2018 0922   PLT 278 10/23/2017 2016   MCV 90 12/26/2018 0922   MCH 30.8 12/26/2018 0922   MCH 30.5 10/23/2017 2016   MCHC 34.4 12/26/2018 0922   MCHC 33.6 10/23/2017 2016   RDW 12.7 12/26/2018 0922   LYMPHSABS 1.9 12/26/2018 0922   MONOABS 0.5 09/19/2016 0050   EOSABS 0.1 12/26/2018 0922   BASOSABS 0.0 12/26/2018 0922   Iron/TIBC/Ferritin/ %Sat No results found for: IRON, TIBC, FERRITIN, IRONPCTSAT Lipid Panel     Component Value Date/Time   CHOL 186 12/26/2018 0922   TRIG 77 12/26/2018 0922   HDL 52 12/26/2018 0922   LDLCALC 119 (H) 12/26/2018 0922   Hepatic Function Panel     Component Value Date/Time   PROT 7.2 12/26/2018 0922   ALBUMIN 4.4 12/26/2018 0922   AST 15 12/26/2018 0922   ALT 19 12/26/2018 0922   ALKPHOS 71 12/26/2018 0922   BILITOT 0.7 12/26/2018 0922      Component Value Date/Time   TSH 3.700 12/26/2018 0922   Results for Vanessa BudBRIGGS, Myka (MRN  [id number]) as of 04/17/2019 14:11  Ref. Range 12/26/2018 09:22  Vitamin D, 25-Hydroxy Latest Ref Range: 30.0 - 100.0 ng/mL 22.6 (L)    I, Marianna Paymentenise Haag, am acting as Energy managertranscriptionist for [place] Har[place], FNP-C.  I have reviewed the above documentation for accuracy and completeness, and I agree with the above.  - Arville Postlewaite, FNP-C.

## 2019-04-18 ENCOUNTER — Encounter (INDEPENDENT_AMBULATORY_CARE_PROVIDER_SITE_OTHER): Payer: Self-pay | Admitting: Family Medicine

## 2019-05-08 ENCOUNTER — Ambulatory Visit (INDEPENDENT_AMBULATORY_CARE_PROVIDER_SITE_OTHER): Payer: 59 | Admitting: Physician Assistant

## 2019-05-15 ENCOUNTER — Telehealth: Payer: Self-pay

## 2019-05-15 NOTE — Telephone Encounter (Signed)
We have attempted to call the patient two times to schedule sleep study.  Patient has been unavailable at the phone numbers we have on file and has not returned our calls. If patient calls back we will schedule them for their sleep study.  

## 2019-06-14 ENCOUNTER — Encounter: Payer: Self-pay | Admitting: Internal Medicine

## 2019-07-18 ENCOUNTER — Ambulatory Visit (INDEPENDENT_AMBULATORY_CARE_PROVIDER_SITE_OTHER): Payer: 59 | Admitting: Internal Medicine

## 2019-07-18 ENCOUNTER — Other Ambulatory Visit: Payer: Self-pay

## 2019-07-18 ENCOUNTER — Encounter: Payer: Self-pay | Admitting: Internal Medicine

## 2019-07-18 VITALS — BP 136/88 | HR 64 | Temp 98.0°F | Ht 65.0 in | Wt 229.0 lb

## 2019-07-18 DIAGNOSIS — K582 Mixed irritable bowel syndrome: Secondary | ICD-10-CM | POA: Diagnosis not present

## 2019-07-18 DIAGNOSIS — R1011 Right upper quadrant pain: Secondary | ICD-10-CM | POA: Diagnosis not present

## 2019-07-18 DIAGNOSIS — G8929 Other chronic pain: Secondary | ICD-10-CM

## 2019-07-18 DIAGNOSIS — K219 Gastro-esophageal reflux disease without esophagitis: Secondary | ICD-10-CM | POA: Diagnosis not present

## 2019-07-18 DIAGNOSIS — K648 Other hemorrhoids: Secondary | ICD-10-CM | POA: Diagnosis not present

## 2019-07-18 DIAGNOSIS — R002 Palpitations: Secondary | ICD-10-CM

## 2019-07-18 NOTE — Progress Notes (Signed)
[name] 58 y.o. [birth year] [id number]  Assessment & Plan:   Encounter Diagnoses  Name Primary?  . Gastroesophageal reflux disease, esophagitis presence not specified Yes  . Chronic RUQ pain   . Irritable bowel syndrome with both constipation and diarrhea   . Hemorrhoids with complication   . Palpitations     Cause not clear. Sounds like SIBO possible, IBS/functinal GI problems. The increase in sxs suggests musculoskeletal but my exam today did not implicate  Will Stop probiotc FD gard prn samples F/U 6 weeks Records review  Lactulose H2 breath test to look for SIBO ? Band hemorrhoids but may not need if can improve bowel habits  ? Change GERD regimen  ? Relationship of GERD  to palpitations - not something I have seen much but suppose acid in esophagus could trigger - we know it can cause pulmonary sxs    RECORDS REVIEW  09/13/2017  Colonoscopy - Collene Mares - 8 polyps , internal hemorrhoids good prep  Path = transverse 8 mm hyperplastic polyp and 7 diminutive sigmoid prolapse polyps  EGD 11/29/2017 [facility] - GE junction ? Barrett's but gastric mucosa w/ inflammation and NO intestinal metaplasia + chronic inactive gastritis   2011 colonoscopy - multiple hyperplastic polyps 3 in ascending colon rest sigmoid   Has hx H pylori + Ab   Additional recommendations  Would repeat a colonoscopy in 2023 given 8 mm right side hyperplastic (serrated) polyp  Do not think needs a repeat EGD or colonoscopy now  Subjective:   Chief Complaint: abdominal pain and bloating  HPI The patient is a 58 year old African-American woman with a 3+ year history of right upper quadrant fullness and bloating.  There is some early satiety.  I do not have all the records but she has seen Dr. Michail Sermon and Dr. Collene Mares.  What I do know is ultrasound x2 and CT scanning of the abdomen and pelvis are negative as are labs as best I know.  She tells me her EGD and colonoscopy procedures have been  okay.  She has been on pantoprazole 20 mg daily and sucralfate 1 g 4 times a day because of reflux.  She is never been on a higher dose of pantoprazole.  She gets a substernal chest pain and some palpitations which she has had for many years and had a negative cardiac evaluation back in 2016 or 2017.  She will have nocturnal symptoms of reflux she thinks despite being very good at her diet restrictions, she props her head up.  She uses generic Gaviscon as needed with some relief.  She is puzzled as to why palpitations occur when she has this.  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  It really does not seem positional though occasionally she can have some pain with twisting or bending it comes and goes with or without eating.  She definitely feels like she gets full.  There is a bloating sensation.  Some belching and flatulence.  She wonders if she has a problem with her bacterial flora.  She has not been on other over-the-counter medications or anticholinergics or antispasmodics.  She complains of intermittently symptomatic hemorrhoids and has one that is a bit swollen and tender today.  Rare rectal bleeding.  Bowel habits mostly regular some occasional diarrhea type symptoms, about once a week she has difficulty defecating.  She will use some Metamucil.  Do not think that the  bowel habits have an effect on her abdominal discomfort. She has been on a probiotic for a while without benefit.   CT abd/pelvis 02/2018 negative  US negative 08/2017 (mann) also 09/18/2017 HIDA scan with ejection fraction, 62% ejection fraction no symptoms with ingestion of Ensure negative study And 2017 also   Wt Readings from Last 3 Encounters:  07/18/19 229 lb (103.9 kg)  02/07/19 240 lb (108.9 kg)  02/06/19 246 lb (111.6 kg)   She is in the medical weight loss clinic, the bariatric clinic and having success there.  Symptoms predate that  therapy.  She also asked if her adrenal glands could be involved since she has the palpitations. Allergies  Allergen Reactions  . Amoxicillin Hives and Swelling  . Shellfish Allergy Hives    Any seafood.   Current Meds  Medication Sig  . albuterol (PROVENTIL HFA;VENTOLIN HFA) 108 (90 Base) MCG/ACT inhaler Inhale 2 puffs into the lungs every 6 (six) hours as needed for wheezing or shortness of breath.  . ALOE VERA JUICE PO Take by mouth as needed. 2 oz prn   . AMBULATORY NON FORMULARY MEDICATION Flora Cleanse 1 tab oral daily  . CALCIUM CITRATE PO Take 1,000 mg by mouth daily.  . cetirizine (ZYRTEC) 10 MG tablet Take 10 mg by mouth daily.  . Cholecalciferol (VITAMIN D3) 125 MCG (5000 UT) CAPS Take 1 capsule by mouth daily.  Marland Kitchen. MAGNESIUM PO Take 120 mg by mouth daily.  . pantoprazole (PROTONIX) 20 MG tablet Take 1 tablet (20 mg total) by mouth daily.  . Probiotic Product (PRO-BIOTIC BLEND) CAPS Take 2 capsules by mouth as needed.   . sucralfate (CARAFATE) 1 g tablet Take 1 tablet (1 g total) by mouth 4 (four) times daily -  with meals and at bedtime.   Past Medical History:  Diagnosis Date  . Acid reflux   . Back pain   . Chest pain   . Colon polyps   . Fe deficiency anemia   . Frequent headaches   . GERD (gastroesophageal reflux disease)   . Hypertension   . Joint pain   . Menopausal symptoms   . Mild asthma   . Obesity   . Palpitations   . Pneumonia   . Shortness of breath   . Swelling of both lower extremities    Past Surgical History:  Procedure Laterality Date  . ABDOMINAL HYSTERECTOMY  2005  . APPENDECTOMY  1984  . CERVICAL CANCER CELLS FROZEN  1991  . 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   Review of Systems As per HPI, has some night sweats pedal edema insomnia some back pain at times anxiety and allergy and sinus problems.  All other review of systems are negative.  Objective:   Physical Exam @BP  136/88 (BP Location: Left Arm, Patient Position: Sitting, Cuff Size: Normal)   Pulse 64   Temp 98 F (36.7 C)   Ht 5\' 5"  (1.651 m) Comment: height measured without shoes  Wt 229 lb (103.9 kg)   BMI 38.11 kg/m @  General:  Well-developed, well-nourished and in no acute distress Eyes:  anicteric. ENT:  Mouth and posterior pharynx free of lesions.  Neck:   supple w/o thyromegaly or mass.  Lungs: Clear to auscultation bilaterally. Heart:  S1S2, no rubs, murmurs, gallops. Abdomen:  soft, mildly-tender to deep palpation in the right upper quadrant, no hepatosplenomegaly, hernia, or mass and BS+.   Negative carnett's sign  Back NT no CVAT Rectal:  Female staff present  Small anal tag and swollen external hemorrhoid RP    Lymph:  no cervical or supraclavicular adenopathy. Extremities:   no edema, cyanosis or clubbing Skin   no rash. Neuro:  A&O x 3.  Psych:  appropriate mood and  Affect.   Data Reviewed:  See HPI + records requested and reviewed

## 2019-07-18 NOTE — Patient Instructions (Addendum)
You have been given a testing kit to check for small intestine bacterial overgrowth (SIBO) which is completed by a company named [employer]. Make sure to return your test in the mail using the return mailing label given you along with the kit. Your demographic and insurance information have already been sent to the company and they should be in contact with you over the next week regarding this test. Please keep in mind that you will be getting a call from phone number [phone] or a similar number. If you do not hear from them within this time frame, please call our office at [phone].    We are going to obtain your medical records for review from Dr Michail Sermon and Dr Collene Mares.   You can stop your probiotic.   We are giving you FDgard to try as needed.   Please follow up with Dr Carlean Purl in 6 weeks.  I appreciate the opportunity to care for you. Silvano Rusk, MD, [facility]

## 2019-07-22 ENCOUNTER — Encounter: Payer: Self-pay | Admitting: Internal Medicine

## 2019-08-08 ENCOUNTER — Other Ambulatory Visit: Payer: Self-pay | Admitting: Internal Medicine

## 2019-08-08 ENCOUNTER — Encounter: Payer: Self-pay | Admitting: Internal Medicine

## 2019-08-08 ENCOUNTER — Telehealth: Payer: Self-pay | Admitting: Internal Medicine

## 2019-08-08 DIAGNOSIS — K6389 Other specified diseases of intestine: Secondary | ICD-10-CM

## 2019-08-08 DIAGNOSIS — K638219 Small intestinal bacterial overgrowth, unspecified: Secondary | ICD-10-CM

## 2019-08-08 HISTORY — DX: Small intestinal bacterial overgrowth, unspecified: K63.8219

## 2019-08-08 HISTORY — DX: Other specified diseases of intestine: K63.89

## 2019-08-08 MED ORDER — RIFAXIMIN 550 MG PO TABS: 550.0000 mg | ORAL_TABLET | Freq: Three times a day (TID) | ORAL | 0 refills | Status: AC

## 2019-08-08 NOTE — Telephone Encounter (Signed)
Please see additional phone notes for details.  

## 2019-08-08 NOTE — Progress Notes (Signed)
Left message for patient to call back  

## 2019-08-08 NOTE — Progress Notes (Signed)
Patient notified of the results and recommendations 

## 2019-08-08 NOTE — Progress Notes (Signed)
Please tell patient That she has tested + for SIBO (small intestinal bacterial overgrowth)  I have Rxed Xifaxan for this and will regroup with her at her 9/29 appt

## 2019-08-08 NOTE — Telephone Encounter (Signed)
Pt returned your call and would like a call back .. °

## 2019-08-29 ENCOUNTER — Ambulatory Visit (INDEPENDENT_AMBULATORY_CARE_PROVIDER_SITE_OTHER): Payer: 59 | Admitting: Internal Medicine

## 2019-08-29 ENCOUNTER — Encounter: Payer: Self-pay | Admitting: Internal Medicine

## 2019-08-29 VITALS — BP 122/70 | HR 72 | Temp 98.0°F | Ht 66.0 in | Wt 236.6 lb

## 2019-08-29 DIAGNOSIS — K6389 Other specified diseases of intestine: Secondary | ICD-10-CM

## 2019-08-29 DIAGNOSIS — R1011 Right upper quadrant pain: Secondary | ICD-10-CM | POA: Diagnosis not present

## 2019-08-29 DIAGNOSIS — K219 Gastro-esophageal reflux disease without esophagitis: Secondary | ICD-10-CM | POA: Diagnosis not present

## 2019-08-29 DIAGNOSIS — K648 Other hemorrhoids: Secondary | ICD-10-CM

## 2019-08-29 NOTE — Progress Notes (Signed)
Shamone Winzer 58 y.o. May 02, 1961 [id number]  Assessment & Plan:   Encounter Diagnoses  Name Primary?  . RUQ pain Yes  . Gastroesophageal reflux disease without esophagitis   . Hemorrhoids with complication   . Small intestinal bacterial overgrowth      Cause of her problems remains unclear.  It is aggravated by eating but persists i.e. the right upper quadrant pain and she is tender.  Thus it could be the gallbladder despite lack of objective data over the years with ultrasound CT scanning and HIDA scan.  Sometimes imaging and labs do not reveal that as a cause.  I had previously thought it might be musculoskeletal given that it is worse when she is standing and working but I cannot make a case for that easily.  We will give a trial of the FD guard which she did not take as some of this does sound like dyspepsia as opposed to a gallbladder problem, she will also give a trial of pancreatic enzymes Zenpep 25 K 2 with meals one with snacks, and also we will recheck HIDA scan with ejection fraction and consider surgical evaluation.  One option would be an empiric cholecystectomy.  She may still get some benefit from the Xifaxan for small intestinal bacterial overgrowth but I would have hoped for a better response.  Hold celiac testing in reserve   Not discussed today, is her BMI and weight which could be playing a role.  I think we need to look for other problems but at some point introduced the idea of a diet change for weight loss.  I appreciate the opportunity to care for this patient. CC: Koirala, Dibas, MD  Subjective:   Chief Complaint: Right upper quadrant pain and bloating  HPI The patient is here for follow-up, she continues to have epigastric and right upper quadrant discomfort that is constant, sort of "something is there" a lot of the time that intensifies.  Over the years she has had an extensive work-up outlined in my previous note and in the records.  I had her do a  small intestinal bacterial overgrowth breath test that was positive and treated her with Xifaxan which she just finished a few days ago, and she does not really feel any different.  She wonders if it is in her diet, she asked about the possibility of celiac disease.  She does eat reasonably well and seems like with oatmeal eggs banana some cereal in the morning salads lean protein for lunch and supper. Allergies  Allergen Reactions  . Amoxicillin Hives and Swelling  . Shellfish Allergy Hives    Any seafood.   Current Meds  Medication Sig  . albuterol (PROVENTIL HFA;VENTOLIN HFA) 108 (90 Base) MCG/ACT inhaler Inhale 2 puffs into the lungs every 6 (six) hours as needed for wheezing or shortness of breath.  . ALOE VERA JUICE PO Take by mouth as needed. 2 oz prn   . AMBULATORY NON FORMULARY MEDICATION Flora Cleanse 1 tab oral daily  . CALCIUM CITRATE PO Take 1,000 mg by mouth daily.  . cetirizine (ZYRTEC) 10 MG tablet Take 10 mg by mouth daily.  . Cholecalciferol (VITAMIN D3) 125 MCG (5000 UT) CAPS Take 1 capsule by mouth daily.  Marland Kitchen MAGNESIUM PO Take 120 mg by mouth daily.  . sucralfate (CARAFATE) 1 g tablet Take 1 tablet (1 g total) by mouth 4 (four) times daily -  with meals and at bedtime.  . [DISCONTINUED] pantoprazole (PROTONIX) 20 MG tablet Take 1  tablet (20 mg total) by mouth daily.   Past Medical History:  Diagnosis Date  . Chronic RUQ pain   . Colon polyps    hyperplastic - some right sided  . Fe deficiency anemia   . Frequent headaches   . GERD (gastroesophageal reflux disease)   . Helicobacter pylori antibody positive   . Hemorrhoids   . Hypertension   . IBS (irritable bowel syndrome)   . Joint pain   . Menopausal symptoms   . Mild asthma   . Obesity   . Palpitations   . Pneumonia   . Shortness of breath   . Small intestinal bacterial overgrowth 08/08/2019  . Swelling of both lower extremities    Past Surgical History:  Procedure Laterality Date  . ABDOMINAL  HYSTERECTOMY  2005  . APPENDECTOMY  1984  . CERVICAL CANCER CELLS FROZEN  1991  . COLONOSCOPY     2011 and 2018  . DILATION AND CURETTAGE OF UTERUS  1991  . 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   Review of Systems As per HPI  Objective:   Physical Exam BP 122/70   Pulse 72   Temp 98 F (36.7 C) (Oral)   Ht 5\' 6"  (1.676 m)   Wt 236 lb 9.6 oz (107.3 kg)   BMI 38.19 kg/m  Obese Tender RUQ - deep - o/w ok and back and ribs not tender   25 minutes time spent with patient > half in counseling coordination of care

## 2019-08-29 NOTE — Patient Instructions (Addendum)
Please try your FDgard separately from the zenpep samples we have given you. Take the zenpep as follows: 2 capsules with meals and 1 capsule with snacks.   Pick up your Protonix from the drug store and start that, 30 minutes to an hour before breakfast.   You have been scheduled for a HIDA scan at [facility] (1st floor) on 09/11/2019. Please arrive 15 minutes prior to your scheduled appointment at  5:17OH. Make certain not to have anything to eat or drink at least 6 hours prior to your test. Should this appointment date or time not work well for you, please call radiology scheduling at [phone].  _____________________________________________________________________ hepatobiliary (HIDA) scan is an imaging procedure used to diagnose problems in the liver, gallbladder and bile ducts. In the HIDA scan, a radioactive chemical or tracer is injected into a vein in your arm. The tracer is handled by the liver like bile. Bile is a fluid produced and excreted by your liver that helps your digestive system break down fats in the foods you eat. Bile is stored in your gallbladder and the gallbladder releases the bile when you eat a meal. A special nuclear medicine scanner (gamma camera) tracks the flow of the tracer from your liver into your gallbladder and small intestine.  During your HIDA scan  You'll be asked to change into a hospital gown before your HIDA scan begins. Your health care team will position you on a table, usually on your back. The radioactive tracer is then injected into a vein in your arm.The tracer travels through your bloodstream to your liver, where it's taken up by the bile-producing cells. The radioactive tracer travels with the bile from your liver into your gallbladder and through your bile ducts to your small intestine.You may feel some pressure while the radioactive tracer is injected into your vein. As you lie on the table, a special gamma camera is positioned over your abdomen  taking pictures of the tracer as it moves through your body. The gamma camera takes pictures continually for about an hour. You'll need to keep still during the HIDA scan. This can become uncomfortable, but you may find that you can lessen the discomfort by taking deep breaths and thinking about other things. Tell your health care team if you're uncomfortable. The radiologist will watch on a computer the progress of the radioactive tracer through your body. The HIDA scan may be stopped when the radioactive tracer is seen in the gallbladder and enters your small intestine. This typically takes about an hour. In some cases extra imaging will be performed if original images aren't satisfactory, if morphine is given to help visualize the gallbladder or if the medication CCK is given to look at the contraction of the gallbladder. This test typically takes 2 hours to complete. ________________________________________________________________________   I appreciate the opportunity to care for you. Silvano Rusk, MD, [facility]

## 2019-09-04 ENCOUNTER — Other Ambulatory Visit: Payer: Self-pay

## 2019-09-04 DIAGNOSIS — Z20822 Contact with and (suspected) exposure to covid-19: Secondary | ICD-10-CM

## 2019-09-06 LAB — NOVEL CORONAVIRUS, NAA: SARS-CoV-2, NAA: NOT DETECTED

## 2019-09-11 ENCOUNTER — Encounter (HOSPITAL_COMMUNITY): Payer: Self-pay

## 2019-09-11 ENCOUNTER — Ambulatory Visit (HOSPITAL_COMMUNITY): Admission: RE | Admit: 2019-09-11 | Payer: 59 | Source: Ambulatory Visit

## 2019-10-03 ENCOUNTER — Other Ambulatory Visit: Payer: Self-pay

## 2019-10-03 ENCOUNTER — Ambulatory Visit (HOSPITAL_COMMUNITY)
Admission: RE | Admit: 2019-10-03 | Discharge: 2019-10-03 | Disposition: A | Discharge status: Home / Self Care | Payer: 59 | Source: Ambulatory Visit | Attending: Internal Medicine | Admitting: Internal Medicine

## 2019-10-03 DIAGNOSIS — R1011 Right upper quadrant pain: Secondary | ICD-10-CM

## 2019-10-03 MED ORDER — TECHNETIUM TC 99M MEBROFENIN IV KIT
5.0000 | PACK | Freq: Once | INTRAVENOUS | Status: AC | PRN
  Administered 2019-10-03: 16:00:00 5 via INTRAVENOUS

## 2019-10-06 NOTE — Progress Notes (Signed)
Rosamond,  This test is ok - Gallbladder working properly.  So that is good news but does not give [name] an answer.  I have a couple of other ideas but wanted an update from you so please tell me how you are doing.  I appreciate the opportunity to care for you. Gatha Mayer, MD, [facility]

## 2019-10-10 ENCOUNTER — Other Ambulatory Visit: Payer: Self-pay | Admitting: Obstetrics and Gynecology

## 2019-10-10 DIAGNOSIS — Z1231 Encounter for screening mammogram for malignant neoplasm of breast: Secondary | ICD-10-CM

## 2019-10-16 ENCOUNTER — Other Ambulatory Visit: Payer: Self-pay | Admitting: Internal Medicine

## 2019-10-16 MED ORDER — RIFAXIMIN 550 MG PO TABS: 550.0000 mg | ORAL_TABLET | Freq: Three times a day (TID) | ORAL | 0 refills | Status: AC

## 2019-10-19 ENCOUNTER — Other Ambulatory Visit: Payer: Self-pay

## 2019-10-19 ENCOUNTER — Ambulatory Visit
Admission: RE | Admit: 2019-10-19 | Discharge: 2019-10-19 | Disposition: A | Discharge status: Home / Self Care | Payer: 59 | Source: Ambulatory Visit | Attending: Obstetrics and Gynecology | Admitting: Obstetrics and Gynecology

## 2019-10-19 DIAGNOSIS — Z1231 Encounter for screening mammogram for malignant neoplasm of breast: Secondary | ICD-10-CM

## 2020-02-01 IMAGING — NM NM HEPATO W/GB/PHARM/[PERSON_NAME]
1 series · 6 of 6 positions shown · non-contrast
Comparison: None.

CLINICAL DATA: Chronic right upper quadrant pain.

EXAM:
NUCLEAR MEDICINE HEPATOBILIARY IMAGING WITH GALLBLADDER EF
TECHNIQUE: Sequential images of the abdomen were obtained [DATE] minutes
following intravenous administration of radiopharmaceutical. After
oral ingestion of Ensure, gallbladder ejection fraction was
determined. At 60 min, normal ejection fraction is greater than 33%.
RADIOPHARMACEUTICALS:  5.2 mCi Pc-MMm  Choletec IV

[he hepatobiliary · 4.52mm/px · 6 of 60 frames shown]
[frame 6/60]
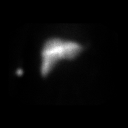
[frame 16/60]
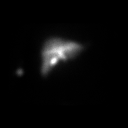
[frame 26/60]
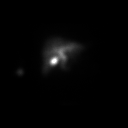
[frame 36/60]
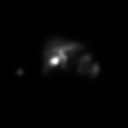
[frame 46/60]
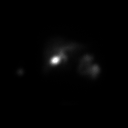
[frame 56/60]
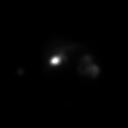

[6 of 6 positions shown; findings below may reference images not displayed]

FINDINGS: Prompt uptake and biliary excretion of activity by the liver is
seen. Gallbladder activity is visualized, consistent with patency of
cystic duct. Biliary activity passes into small bowel, consistent
with patent common bile duct.

Calculated gallbladder ejection fraction is 44%. (Normal gallbladder
ejection fraction with Ensure is greater than 33%.)
IMPRESSION: Normal hepatobiliary scan, demonstrating patency of cystic and
common bile ducts.

Normal gallbladder ejection fraction.

## 2020-05-06 ENCOUNTER — Ambulatory Visit (INDEPENDENT_AMBULATORY_CARE_PROVIDER_SITE_OTHER): Payer: No Typology Code available for payment source

## 2020-05-06 ENCOUNTER — Encounter: Payer: Self-pay | Admitting: Podiatry

## 2020-05-06 ENCOUNTER — Other Ambulatory Visit: Payer: Self-pay

## 2020-05-06 ENCOUNTER — Ambulatory Visit (INDEPENDENT_AMBULATORY_CARE_PROVIDER_SITE_OTHER): Payer: No Typology Code available for payment source | Admitting: Podiatry

## 2020-05-06 DIAGNOSIS — M7661 Achilles tendinitis, right leg: Secondary | ICD-10-CM

## 2020-05-06 DIAGNOSIS — M7662 Achilles tendinitis, left leg: Secondary | ICD-10-CM

## 2020-05-06 DIAGNOSIS — M7730 Calcaneal spur, unspecified foot: Secondary | ICD-10-CM

## 2020-05-06 MED ORDER — MELOXICAM 15 MG PO TABS
15.0000 mg | ORAL_TABLET | Freq: Every day | ORAL | 1 refills | Status: DC
Start: 2020-05-06 — End: 2022-12-22

## 2020-05-06 MED ORDER — METHYLPREDNISOLONE 4 MG PO TBPK: ORAL_TABLET | ORAL | 0 refills | Status: DC

## 2020-05-06 NOTE — Progress Notes (Signed)
   HPI: 59 y.o. female presenting today as a new patient for evaluation of bilateral posterior heel pain.  Patient states that she has significant pain to the Achilles tendons of her bilateral feet for the past 2-3 months now.  Sudden onset with stiffness burning aching in the mornings especially.  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  Patient denies injury.  She has not done anything for treatment.  Past Medical History:  Diagnosis Date  . Chronic RUQ pain   . Colon polyps    hyperplastic - some right sided  . Fe deficiency anemia   . Frequent headaches   . GERD (gastroesophageal reflux disease)   . Helicobacter pylori antibody positive   . Hemorrhoids   . Hypertension   . IBS (irritable bowel syndrome)   . Joint pain   . Menopausal symptoms   . Mild asthma   . Obesity   . Palpitations   . Pneumonia   . Shortness of breath   . Small intestinal bacterial overgrowth 08/08/2019  . Swelling of both lower extremities       Physical Exam: General: The patient is alert and oriented x3 in no acute distress.  Dermatology: Skin is warm, dry and supple bilateral lower extremities. Negative for open lesions or macerations.  Vascular: Palpable pedal pulses bilaterally. No edema or erythema noted. Capillary refill within normal limits.  Neurological: Epicritic and protective threshold grossly intact bilaterally.   Musculoskeletal Exam: Pain on palpation noted to the posterior tubercle of the bilateral calcaneus at the insertion of the Achilles tendon consistent with retrocalcaneal bursitis. Range of motion within normal limits. Muscle strength 5/5 in all muscle groups bilateral lower extremities.  Radiographic Exam:  Posterior calcaneal spur noted to the respective calcaneus on lateral view. No fracture or dislocation noted. Normal osseous mineralization noted.     Assessment: 1. Insertional Achilles tendinitis bilateral 2. Retrocalcaneal bursitis   Plan of  Care:  1. Patient was evaluated. Radiographs were reviewed today. 2. Injection of 0.5 mL Celestone Soluspan injected into the retrocalcaneal bursa bilateral. Care was taken to avoid direct injection into the Achilles tendon. 3.  Prescription for Medrol Dosepak 4.  Prescription for meloxicam to begin taking after completion of the Dosepak 5.  Recommend shoes with a slight heel lift to alleviate tension from the Achilles tendon 6.  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 Felecia Shelling, DPM    [address]                                        [place], [place] [address]                Office [phone]  Fax [phone]

## 2020-05-10 ENCOUNTER — Ambulatory Visit: Payer: 59 | Admitting: Podiatry

## 2020-06-10 ENCOUNTER — Ambulatory Visit (INDEPENDENT_AMBULATORY_CARE_PROVIDER_SITE_OTHER): Payer: No Typology Code available for payment source | Admitting: Podiatry

## 2020-06-10 ENCOUNTER — Encounter: Payer: Self-pay | Admitting: Podiatry

## 2020-06-10 ENCOUNTER — Other Ambulatory Visit: Payer: Self-pay

## 2020-06-10 DIAGNOSIS — M7661 Achilles tendinitis, right leg: Secondary | ICD-10-CM

## 2020-06-10 DIAGNOSIS — M7730 Calcaneal spur, unspecified foot: Secondary | ICD-10-CM | POA: Diagnosis not present

## 2020-06-10 DIAGNOSIS — M7662 Achilles tendinitis, left leg: Secondary | ICD-10-CM

## 2020-06-11 NOTE — Progress Notes (Signed)
   HPI: 59 y.o. female presenting today for follow-up evaluation regarding Achilles tendinitis to the bilateral heels.  Patient states that overall she is doing very well with significant improvement.  She states that the right Achilles tendon/heel is doing very well and mostly asymptomatic.  She states that the injection helped significantly last visit.  Patient had a reaction to the Medrol Dosepak and quit taking it.  It caused tachycardia.  She has not taken the meloxicam.  She presents today for further treatment and evaluation  Past Medical History:  Diagnosis Date  . Chronic RUQ pain   . Colon polyps    hyperplastic - some right sided  . Fe deficiency anemia   . Frequent headaches   . GERD (gastroesophageal reflux disease)   . Helicobacter pylori antibody positive   . Hemorrhoids   . Hypertension   . IBS (irritable bowel syndrome)   . Joint pain   . Menopausal symptoms   . Mild asthma   . Obesity   . Palpitations   . Pneumonia   . Shortness of breath   . Small intestinal bacterial overgrowth 08/08/2019  . Swelling of both lower extremities       Physical Exam: General: The patient is alert and oriented x3 in no acute distress.  Dermatology: Skin is warm, dry and supple bilateral lower extremities. Negative for open lesions or macerations.  Vascular: Palpable pedal pulses bilaterally. No edema or erythema noted. Capillary refill within normal limits.  Neurological: Epicritic and protective threshold grossly intact bilaterally.   Musculoskeletal Exam: Pain on palpation noted to the posterior tubercle of the bilateral calcaneus at the insertion of the Achilles tendon consistent with retrocalcaneal bursitis left greater than right. Range of motion within normal limits. Muscle strength 5/5 in all muscle groups bilateral lower extremities.  Radiographic Exam:  Posterior calcaneal spur noted to the respective calcaneus on lateral view. No fracture or dislocation noted. Normal  osseous mineralization noted.     Assessment: 1. Insertional Achilles tendinitis bilateral. L > R 2. Retrocalcaneal bursitis   Plan of Care:  1. Patient was evaluated. 2.  Patient declined injections today 3.  Recommend that the patient begin taking meloxicam daily 4.  Continue wearing [place] shoes 5.  Stressed the importance of stretching exercises daily to the Achilles tendon 6.  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 Felecia Shelling, DPM    [address]                                        [place], [place] [address]                Office [phone]  Fax [phone]

## 2020-07-15 ENCOUNTER — Ambulatory Visit: Payer: No Typology Code available for payment source | Admitting: Podiatry

## 2020-09-17 ENCOUNTER — Encounter: Payer: Self-pay | Admitting: Gastroenterology

## 2020-09-17 ENCOUNTER — Ambulatory Visit (INDEPENDENT_AMBULATORY_CARE_PROVIDER_SITE_OTHER): Payer: No Typology Code available for payment source | Admitting: Gastroenterology

## 2020-09-17 VITALS — BP 116/80 | HR 72 | Ht 66.0 in | Wt 256.0 lb

## 2020-09-17 DIAGNOSIS — R1011 Right upper quadrant pain: Secondary | ICD-10-CM | POA: Diagnosis not present

## 2020-09-17 DIAGNOSIS — G8929 Other chronic pain: Secondary | ICD-10-CM

## 2020-09-17 NOTE — Progress Notes (Signed)
09/17/2020 [name] [id number] 12-Dec-1960    HISTORY OF PRESENT ILLNESS:  This is a pleasant 59 year old female who is a patient of Dr. Marvell Fuller.  She is here today with complaints of RUQ abdominal pain,  She has had this pain for years and has undergone extensive evaluation including EGD, colonoscopy, ultrasound, CT scan, HIDA scans.  She has been seen here as well as [facility] and [facility] as well.  She is here today with the same RUQ abdominal pain.  Reports a constant ache in the RUQ.  Sometimes hurts more with eating and sometimes more with standing/working throughout the day, but still does not get more than a 3-4/10 on the pain scale.  Occasional nausea but no vomiting.  She has been treated with Xifaxan for SIBO and she says that it helped with her acid reflux but not with her abdominal pain.  She used to take pantoprazole for acid reflux but is not on any type of acid suppression at this time.  She uses occasional NSAIDs.  She is asking for an MRI to evaluate this.  Past Medical History:  Diagnosis Date  . Chronic RUQ pain   . Colon polyps    hyperplastic - some right sided  . Fe deficiency anemia   . Frequent headaches   . GERD (gastroesophageal reflux disease)   . Helicobacter pylori antibody positive   . Hemorrhoids   . Hypertension   . IBS (irritable bowel syndrome)   . Joint pain   . Menopausal symptoms   . Mild asthma   . Obesity   . Palpitations   . Pneumonia   . Shortness of breath   . Small intestinal bacterial overgrowth 08/08/2019  . Swelling of both lower extremities    Past Surgical History:  Procedure Laterality Date  . ABDOMINAL HYSTERECTOMY  2005  . APPENDECTOMY  1984  . CERVICAL CANCER CELLS FROZEN  1991  . COLONOSCOPY     2011 and 2018  . DILATION AND CURETTAGE OF UTERUS  1991  . ESOPHAGOGASTRODUODENOSCOPY  10/2017    reports that she has never smoked. She has never used smokeless tobacco. She reports that she does not drink alcohol and  does not use drugs. family history includes Breast cancer in her cousin and maternal aunt; Congestive Heart Failure in her mother; Diabetes in her father and mother; High blood pressure in her father and mother; Obesity in her mother. Allergies  Allergen Reactions  . Amoxicillin Hives and Swelling  . Shellfish Allergy Hives    Any seafood.      Outpatient Encounter Medications as of 09/17/2020  Medication Sig  . albuterol (PROVENTIL HFA;VENTOLIN HFA) 108 (90 Base) MCG/ACT inhaler Inhale 2 puffs into the lungs every 6 (six) hours as needed for wheezing or shortness of breath.  . ALOE VERA JUICE PO Take by mouth as needed. 2 oz prn   . AMBULATORY NON FORMULARY MEDICATION Flora Cleanse 1 tab oral daily  . CALCIUM CITRATE PO Take 1,000 mg by mouth daily.  . cetirizine (ZYRTEC) 10 MG tablet Take 10 mg by mouth daily.  . Cholecalciferol (VITAMIN D3) 125 MCG (5000 UT) CAPS Take 1 capsule by mouth daily.  Marland Kitchen MAGNESIUM PO Take 120 mg by mouth daily.  . meloxicam (MOBIC) 15 MG tablet Take 1 tablet (15 mg total) by mouth daily.  . [DISCONTINUED] methylPREDNISolone (MEDROL DOSEPAK) 4 MG TBPK tablet 6 day dose pack - take as directed  . [DISCONTINUED] Pancrelipase, Lip-Prot-Amyl, (  ZENPEP) 25000-79000 units CPEP Take by mouth. 2 capsules with meals and 1 with snacks  . [DISCONTINUED] pantoprazole (PROTONIX) 40 MG tablet Take 40 mg by mouth daily.  . [DISCONTINUED] sucralfate (CARAFATE) 1 g tablet Take 1 tablet (1 g total) by mouth 4 (four) times daily -  with meals and at bedtime.   No facility-administered encounter medications on file as of 09/17/2020.    REVIEW OF SYSTEMS  : All other systems reviewed and negative except where noted in the History of Present Illness.  PHYSICAL EXAM: BP 116/80   Pulse 72   Ht 5\' 6"  (1.676 m)   Wt 256 lb (116.1 kg)   BMI 41.32 kg/m  General: Well developed AA female in no acute distress Head: Normocephalic and atraumatic Eyes:  Sclerae anicteric,  conjunctiva pink. Ears: Normal auditory acuity Lungs: Clear throughout to auscultation; no W/R/R. Heart: Regular rate and rhythm; no M/R/G. Abdomen: Soft, non-distended.  BS present.  Non-tender. Musculoskeletal: Symmetrical with no gross deformities  Skin: No lesions on visible extremities Extremities: No edema  Neurological: Alert oriented x 4, grossly non-focal Psychological:  Alert and cooperative. Normal mood and affect  ASSESSMENT AND PLAN: *RUQ abdominal pain:  This has been ongoing for a few years and she has had extensive evaluation.  It is a constant ache in the RUQ.  Has not worsened.  She is asking for an MRI, which is really the only thing that hasn't been done.  I am not sure that it is going to give an answer but we can try for it.  If it is not approved then can consider repeating CT.  I think that ultimately she may need to see a surgeon to discuss possible cholecystectomy.   CC:  Koirala, Dibas, MD

## 2020-09-17 NOTE — Patient Instructions (Signed)
If you are age 58 or older, your body mass index should be between 23-30. Your Body mass index is 41.32 kg/m. If this is out of the aforementioned range listed, please consider follow up with your Primary Care Provider.  If you are age 22 or younger, your body mass index should be between 19-25. Your Body mass index is 41.32 kg/m. If this is out of the aformentioned range listed, please consider follow up with your Primary Care Provider.   You have been scheduled for an MRI at [facility] on Tuesday 09/24/20. Your appointment time is 8 am. Please arrive 15 minutes prior to your appointment time for registration purposes. Please make certain not to have anything to eat or drink 6 hours prior to your test. In addition, if you have any metal in your body, have a pacemaker or defibrillator, please be sure to let your ordering physician know. This test typically takes 45 minutes to 1 hour to complete. Should you need to reschedule, please call [phone] to do so.

## 2020-09-24 ENCOUNTER — Ambulatory Visit (HOSPITAL_COMMUNITY)
Admission: RE | Admit: 2020-09-24 | Discharge: 2020-09-24 | Disposition: A | Discharge status: Home / Self Care | Payer: No Typology Code available for payment source | Source: Ambulatory Visit | Attending: Gastroenterology | Admitting: Gastroenterology

## 2020-09-24 ENCOUNTER — Other Ambulatory Visit: Payer: Self-pay | Admitting: Gastroenterology

## 2020-09-24 ENCOUNTER — Other Ambulatory Visit: Payer: Self-pay

## 2020-09-24 DIAGNOSIS — R1011 Right upper quadrant pain: Secondary | ICD-10-CM | POA: Insufficient documentation

## 2020-09-24 MED ORDER — GADOBUTROL 1 MMOL/ML IV SOLN
10.0000 mL | Freq: Once | INTRAVENOUS | Status: AC | PRN
  Administered 2020-09-24: 10 mL via INTRAVENOUS

## 2020-10-01 ENCOUNTER — Other Ambulatory Visit: Payer: Self-pay | Admitting: Family Medicine

## 2020-10-01 ENCOUNTER — Other Ambulatory Visit: Payer: Self-pay

## 2020-10-01 DIAGNOSIS — Z1231 Encounter for screening mammogram for malignant neoplasm of breast: Secondary | ICD-10-CM

## 2020-10-01 DIAGNOSIS — R1011 Right upper quadrant pain: Secondary | ICD-10-CM

## 2020-10-01 DIAGNOSIS — G8929 Other chronic pain: Secondary | ICD-10-CM

## 2020-10-04 ENCOUNTER — Other Ambulatory Visit: Payer: No Typology Code available for payment source

## 2020-10-04 DIAGNOSIS — G8929 Other chronic pain: Secondary | ICD-10-CM

## 2020-10-04 DIAGNOSIS — R1011 Right upper quadrant pain: Secondary | ICD-10-CM

## 2020-10-04 NOTE — Addendum Note (Signed)
Addended by: Vincenza Hews on: 10/04/2020 08:42 AM   Modules accepted: Orders

## 2020-10-04 NOTE — Addendum Note (Signed)
Addended by: Marcayla Budge on: 10/04/2020 08:42 AM   Modules accepted: Orders  

## 2020-10-07 LAB — IGA: Immunoglobulin A: 264 mg/dL (ref 47–310)

## 2020-10-07 LAB — TISSUE TRANSGLUTAMINASE, IGA: (tTG) Ab, IgA: <1 U/mL

## 2020-11-11 ENCOUNTER — Ambulatory Visit: Payer: No Typology Code available for payment source

## 2020-11-25 ENCOUNTER — Ambulatory Visit
Admission: RE | Admit: 2020-11-25 | Discharge: 2020-11-25 | Disposition: A | Discharge status: Home / Self Care | Payer: No Typology Code available for payment source | Source: Ambulatory Visit | Attending: Family Medicine | Admitting: Family Medicine

## 2020-11-25 ENCOUNTER — Other Ambulatory Visit: Payer: Self-pay

## 2020-11-25 DIAGNOSIS — Z1231 Encounter for screening mammogram for malignant neoplasm of breast: Secondary | ICD-10-CM

## 2021-01-31 ENCOUNTER — Encounter: Payer: Self-pay | Admitting: Gastroenterology

## 2021-01-31 ENCOUNTER — Ambulatory Visit (INDEPENDENT_AMBULATORY_CARE_PROVIDER_SITE_OTHER): Payer: No Typology Code available for payment source | Admitting: Gastroenterology

## 2021-01-31 VITALS — BP 134/72 | HR 80 | Ht 66.0 in | Wt 253.0 lb

## 2021-01-31 DIAGNOSIS — K6389 Other specified diseases of intestine: Secondary | ICD-10-CM | POA: Diagnosis not present

## 2021-01-31 DIAGNOSIS — R1011 Right upper quadrant pain: Secondary | ICD-10-CM

## 2021-01-31 DIAGNOSIS — G8929 Other chronic pain: Secondary | ICD-10-CM | POA: Diagnosis not present

## 2021-01-31 DIAGNOSIS — K219 Gastro-esophageal reflux disease without esophagitis: Secondary | ICD-10-CM | POA: Insufficient documentation

## 2021-01-31 MED ORDER — RIFAXIMIN 550 MG PO TABS: 550.0000 mg | ORAL_TABLET | Freq: Three times a day (TID) | ORAL | 0 refills | Status: AC

## 2021-01-31 NOTE — Addendum Note (Signed)
Addended by: Mariane Duval on: 01/31/2021 01:34 PM   Modules accepted: Orders

## 2021-01-31 NOTE — Progress Notes (Signed)
01/31/2021 [name] [id number] 1961-02-24   HISTORY OF PRESENT ILLNESS:  This is a pleasant 60 year old female who is a patient of Dr. Marvell Fuller.  She is here today with complaints of RUQ abdominal pain,  She has had this pain for years and has undergone extensive evaluation including EGD, colonoscopy, ultrasound, CT scan, HIDA scans, and most recently and MRI.  She has been seen here as well as [facility] and [facility].  She is here today with the same RUQ abdominal pain.  Celiac labs were negative.  She was treated with Xifaxan for SIBO in the past and she feels like that helped her gut in general for a while, but not really sure about the RUQ abdominal pain per se.  She also reports some issues with acid reflux.  When she has issues with that she takes pantoprazole for a period of time until it calms down.  Does not take it ongoingly.  Not taking it currently.   Past Medical History:  Diagnosis Date  . Chronic RUQ pain   . Colon polyps    hyperplastic - some right sided  . Fe deficiency anemia   . Foot pain, left   . Frequent headaches   . GERD (gastroesophageal reflux disease)   . Helicobacter pylori antibody positive   . Hemorrhoids   . Hypertension   . IBS (irritable bowel syndrome)   . Joint pain   . Menopausal symptoms   . Mild asthma   . Obesity   . Palpitations   . Pneumonia   . Shortness of breath   . Small intestinal bacterial overgrowth 08/08/2019  . Swelling of both lower extremities    Past Surgical History:  Procedure Laterality Date  . ABDOMINAL HYSTERECTOMY  2005  . APPENDECTOMY  1984  . CERVICAL CANCER CELLS FROZEN  1991  . COLONOSCOPY     2011 and 2018  . DILATION AND CURETTAGE OF UTERUS  1991  . ESOPHAGOGASTRODUODENOSCOPY  10/2017    reports that she has never smoked. She has never used smokeless tobacco. She reports that she does not drink alcohol and does not use drugs. family history includes Breast cancer in her cousin and maternal aunt;  Congestive Heart Failure in her mother; Diabetes in her father and mother; High blood pressure in her father and mother; Obesity in her mother. Allergies  Allergen Reactions  . Amoxicillin Hives and Swelling  . Shellfish Allergy Hives    Any seafood.      Outpatient Encounter Medications as of 01/31/2021  Medication Sig  . albuterol (PROVENTIL HFA;VENTOLIN HFA) 108 (90 Base) MCG/ACT inhaler Inhale 2 puffs into the lungs every 6 (six) hours as needed for wheezing or shortness of breath.  . ALOE VERA JUICE PO Take by mouth as needed. 2 oz prn  . cetirizine (ZYRTEC) 10 MG tablet Take 10 mg by mouth daily as needed.  . Cholecalciferol (VITAMIN D3) 125 MCG (5000 UT) CAPS Take 1 capsule by mouth daily.  Marland Kitchen MAGNESIUM PO Take 120 mg by mouth daily.  . meloxicam (MOBIC) 15 MG tablet Take 1 tablet (15 mg total) by mouth daily. (Patient taking differently: Take 15 mg by mouth daily as needed.)  . [DISCONTINUED] AMBULATORY NON FORMULARY MEDICATION Flora Cleanse 1 tab oral daily  . [DISCONTINUED] CALCIUM CITRATE PO Take 1,000 mg by mouth daily.   No facility-administered encounter medications on file as of 01/31/2021.     REVIEW OF SYSTEMS  : All other systems  reviewed and negative except where noted in the History of Present Illness.   PHYSICAL EXAM: BP 134/72   Pulse 80   Ht 5\' 6"  (1.676 m)   Wt 253 lb (114.8 kg)   SpO2 99%   BMI 40.84 kg/m  General: Well developed AA female in no acute distress Head: Normocephalic and atraumatic Eyes:  Sclerae anicteric, conjunctiva pink. Ears: Normal auditory acuity Lungs: Clear throughout to auscultation; no W/R/R. Heart: Regular rate and rhythm; no M/R/G. Abdomen: Soft, non-distended.  BS present.  Non-tender. Musculoskeletal: Symmetrical with no gross deformities  Skin: No lesions on visible extremities Extremities: No edema  Neurological: Alert oriented x 4, grossly non-focal Psychological:  Alert and cooperative. Normal mood and  affect  ASSESSMENT AND PLAN: *Chronic RUQ abdominal pain:  Has undergone extensive evaluation including MRI with no source found.  ? If it is her gallbladder despite tests being unremarkable.  She is asking if it could be her back.  That is a possibility, she will discuss that with her PCP.  From a GI standpoint, she was treated for SIBO previously and felt like it helped her gut in general for a little while (not necessarily the RUQ abdominal pain, however).  She would like to try that again.  Will prescribe xifaxan 550 mg TID for 2 weeks.  She will follow that with a daily probiotic such as . *GERD: Takes pantoprazole intermittently as needed, which does help.  If she begins having symptoms frequently then she may need to begin taking it on a regular basis.   CC:  Koirala, Dibas, MD

## 2021-01-31 NOTE — Patient Instructions (Signed)
If you are age 59 or older, your body mass index should be between 23-30. Your Body mass index is 40.84 kg/m. If this is out of the aforementioned range listed, please consider follow up with your Primary Care Provider.  If you are age 27 or younger, your body mass index should be between 19-25. Your Body mass index is 40.84 kg/m. If this is out of the aformentioned range listed, please consider follow up with your Primary Care Provider.   We have sent your demographic information and a prescription for Xifaxan to [facility]. This pharmacy is able to get medication approved through insurance and get you the lowest copay possible. If you have not heard from them within 1 week, please call our office at [phone] to let us know.  Start daily probiotic such as Scientist, product/process development.   If reflux becomes more frequent then may need daily PPI.  FODMAP diet handout provided.  Call back with an update in a month ask for Hilma Favors, RN.

## 2021-05-07 ENCOUNTER — Ambulatory Visit: Payer: No Typology Code available for payment source | Admitting: Gastroenterology

## 2021-08-20 ENCOUNTER — Other Ambulatory Visit: Payer: Self-pay | Admitting: Obstetrics and Gynecology

## 2021-08-20 DIAGNOSIS — E2839 Other primary ovarian failure: Secondary | ICD-10-CM

## 2021-08-22 ENCOUNTER — Other Ambulatory Visit: Payer: Self-pay | Admitting: Obstetrics and Gynecology

## 2021-08-22 DIAGNOSIS — Z1231 Encounter for screening mammogram for malignant neoplasm of breast: Secondary | ICD-10-CM

## 2021-11-28 ENCOUNTER — Ambulatory Visit
Admission: RE | Admit: 2021-11-28 | Discharge: 2021-11-28 | Disposition: A | Discharge status: Home / Self Care | Payer: No Typology Code available for payment source | Source: Ambulatory Visit | Attending: Obstetrics and Gynecology | Admitting: Obstetrics and Gynecology

## 2021-11-28 DIAGNOSIS — Z1231 Encounter for screening mammogram for malignant neoplasm of breast: Secondary | ICD-10-CM

## 2021-12-03 ENCOUNTER — Encounter: Payer: Self-pay | Admitting: Internal Medicine

## 2021-12-03 ENCOUNTER — Ambulatory Visit (INDEPENDENT_AMBULATORY_CARE_PROVIDER_SITE_OTHER): Payer: No Typology Code available for payment source | Admitting: Internal Medicine

## 2021-12-03 VITALS — BP 138/80 | HR 72 | Ht 65.0 in | Wt 255.4 lb

## 2021-12-03 DIAGNOSIS — G8929 Other chronic pain: Secondary | ICD-10-CM

## 2021-12-03 DIAGNOSIS — R1011 Right upper quadrant pain: Secondary | ICD-10-CM | POA: Diagnosis not present

## 2021-12-03 NOTE — Patient Instructions (Signed)
You will be contacted by [facility] in the next 2 days to arrange a Hida scan.  The number on your caller ID will be [phone], please answer when they call.  If you have not heard from them in 2 days please call [phone] to schedule.     I appreciate the opportunity to care for you. Silvano Rusk, MD, [facility]

## 2021-12-03 NOTE — Progress Notes (Signed)
[name] 60 y.o. Nov 10, 1961 [id number]  Assessment & Plan:   Encounter Diagnosis  Name Primary?   Chronic RUQ pain Yes   The etiology of this is not clear.  We have decided to repeat a HIDA scan.  The last when she had was with the Ensure protocol.  Perhaps they will use cholecystokinin this time.  The features of her pain are not clear-cut I do not understand why she has pain when she is standing.  She does not have fatty liver she is never had gallstones and prior ejection fraction was normal.  EGD and colonoscopy by other providers was not revealing.  She has had these pains for a decade probably.  Maybe more.  We have explained through Doug Sou, PA-C and I am explaining today that sometimes empiric cholecystectomy is offered.  I cannot offer that though she can talk to a Careers adviser.  She was not inclined to repeat an upper endoscopy at this time.  I did tell her I thought that that was low yield in terms of sorting out this chronic pain.  CC: Koirala, Dibas, MD   Subjective:   Chief Complaint: RUQ pain since 2020  HPI Patient has chronic right upper quadrant pain described as a heaviness when she is standing.  It also can occur after meals at times.  She has had an extensive work-up including EGD colonoscopy HIDA scan ultrasound CT and in 2021 she had an MRI MRCP all of which have been unrevealing.  She does have some history of heartburn and uses PPI as needed and Carafate as needed for that.  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  Mobic is on her medication list though she is not really using that much now and it is as needed. She tried physical therapy and that has not helped.  Wt Readings from Last 3 Encounters:  12/03/21 255 lb 6 oz (115.8 kg)  01/31/21 253 lb (114.8 kg)  09/17/20 256 lb (116.1 kg)    Allergies  Allergen Reactions   Amoxicillin Hives and Swelling   Shellfish Allergy Hives    Any seafood.   Current Meds   Medication Sig   albuterol (PROVENTIL HFA;VENTOLIN HFA) 108 (90 Base) MCG/ACT inhaler Inhale 2 puffs into the lungs every 6 (six) hours as needed for wheezing or shortness of breath.   ALOE VERA JUICE PO Take by mouth as needed. 2 oz prn   cetirizine (ZYRTEC) 10 MG tablet Take 10 mg by mouth daily as needed.   Cholecalciferol (VITAMIN D3) 125 MCG (5000 UT) CAPS Take 1 capsule by mouth daily.   MAGNESIUM PO Take 120 mg by mouth daily.   meloxicam (MOBIC) 15 MG tablet Take 1 tablet (15 mg total) by mouth daily. (Patient taking differently: Take 15 mg by mouth daily as needed.)   pantoprazole (PROTONIX) 40 MG tablet Take 40 mg by mouth daily.   sucralfate (CARAFATE) 1 g tablet Take 1 g by mouth 4 (four) times daily as needed.   Past Medical History:  Diagnosis Date   Chronic RUQ pain    Colon polyps    hyperplastic - some right sided   Fe deficiency anemia    Foot pain, left    Frequent headaches    GERD (gastroesophageal reflux disease)    Helicobacter pylori antibody positive    Hemorrhoids    Hypertension    IBS (irritable bowel syndrome)    Joint pain  Menopausal symptoms    Mild asthma    Obesity    Palpitations    Pneumonia    Shortness of breath    Small intestinal bacterial overgrowth 08/08/2019   Swelling of both lower extremities    Past Surgical History:  Procedure Laterality Date   ABDOMINAL HYSTERECTOMY  2005   APPENDECTOMY  1984   CERVICAL CANCER CELLS FROZEN  1991   COLONOSCOPY     2011 and 2018   DILATION AND CURETTAGE OF UTERUS  1991   ESOPHAGOGASTRODUODENOSCOPY  10/2017   Social History   Social History Narrative   Married, hair stylist   2 children   No EtOH, tobacco, drugs   family history includes Breast cancer in her cousin and maternal aunt; Congestive Heart Failure in her mother; Diabetes in her father and mother; High blood pressure in her father and mother; Obesity in her mother.   Review of Systems As above  Objective:   Physical  Exam BP 138/80 (BP Location: Left Arm, Patient Position: Sitting, Cuff Size: Large)    Pulse 72    Ht 5\' 5"  (1.651 m)    Wt 255 lb 6 oz (115.8 kg)    BMI 42.50 kg/m  Obese bw NAD Back straight spine, nontender and no CVAT Abd obese soft, mildly tender RUQ into epigastrium neg [place]

## 2021-12-15 ENCOUNTER — Other Ambulatory Visit: Payer: Self-pay

## 2021-12-15 ENCOUNTER — Encounter (HOSPITAL_COMMUNITY)
Admission: RE | Admit: 2021-12-15 | Discharge: 2021-12-15 | Disposition: A | Discharge status: Home / Self Care | Payer: No Typology Code available for payment source | Source: Ambulatory Visit | Attending: Internal Medicine | Admitting: Internal Medicine

## 2021-12-15 DIAGNOSIS — G8929 Other chronic pain: Secondary | ICD-10-CM | POA: Diagnosis present

## 2021-12-15 DIAGNOSIS — R1011 Right upper quadrant pain: Secondary | ICD-10-CM | POA: Insufficient documentation

## 2021-12-15 MED ORDER — TECHNETIUM TC 99M MEBROFENIN IV KIT
5.0000 | PACK | Freq: Once | INTRAVENOUS | Status: AC | PRN
  Administered 2021-12-15: 5 via INTRAVENOUS

## 2021-12-15 MED ORDER — TECHNETIUM TC 99M MEBROFENIN IV KIT: 5.0000 | PACK | Freq: Once | INTRAVENOUS | Status: DC | PRN

## 2021-12-31 ENCOUNTER — Encounter: Payer: Self-pay | Admitting: Internal Medicine

## 2022-01-09 ENCOUNTER — Telehealth: Payer: Self-pay

## 2022-01-09 NOTE — Telephone Encounter (Signed)
Received Fax from [facility] stating that a [facility]ologist reviewed the pt's referral and medical records and determined that an additional evaluation with [facility] is very unlikely to alter the pt's diagnosis or management, so they cannot offer her an appointment at this time.  Left Message for pt to call back

## 2022-01-12 NOTE — Telephone Encounter (Signed)
Received Fax from [facility] stating that a [facility]enterologist reviewed the pt's referral and medical records and determined that an additional evaluation with [facility] is very unlikely to alter the pt's diagnosis or management, so they cannot offer her an appointment at this time.  Pt made aware: Pt verbalized understanding with all questions answered.

## 2022-02-09 ENCOUNTER — Ambulatory Visit
Admission: RE | Admit: 2022-02-09 | Discharge: 2022-02-09 | Disposition: A | Discharge status: Home / Self Care | Payer: No Typology Code available for payment source | Source: Ambulatory Visit | Attending: Obstetrics and Gynecology | Admitting: Obstetrics and Gynecology

## 2022-02-09 DIAGNOSIS — E2839 Other primary ovarian failure: Secondary | ICD-10-CM

## 2022-04-17 ENCOUNTER — Other Ambulatory Visit (HOSPITAL_COMMUNITY): Payer: Self-pay | Admitting: Family Medicine

## 2022-04-17 DIAGNOSIS — E7800 Pure hypercholesterolemia, unspecified: Secondary | ICD-10-CM

## 2022-05-11 ENCOUNTER — Ambulatory Visit (HOSPITAL_COMMUNITY)
Admission: RE | Admit: 2022-05-11 | Discharge: 2022-05-11 | Disposition: A | Discharge status: Home / Self Care | Payer: No Typology Code available for payment source | Source: Ambulatory Visit | Attending: Family Medicine | Admitting: Family Medicine

## 2022-05-11 DIAGNOSIS — E7800 Pure hypercholesterolemia, unspecified: Secondary | ICD-10-CM | POA: Insufficient documentation

## 2022-07-05 NOTE — Progress Notes (Signed)
New Patient Note  RE: [name] MRN: [id number] DOB: 05-15-1961 Date of Office Visit: 07/06/2022  Consult requested by: Camie Patience, FNP Primary care provider: [name], MD  Chief Complaint: Establish Care, Urticaria (Itchy and congestion bothers her the most ), and Nasal Congestion  History of Present Illness: I had the pleasure of seeing [name] for initial evaluation at the [facility] on 07/06/2022. She is a 61 y.o. female, who is referred here by Docia Chuck, [place], MD for the evaluation of asthma, urticaria and nasal congestion.  Breathing:  She reports symptoms of coughing (from PND), wheezing, nocturnal awakenings for 5+ years. Current medications include albuterol prn which help. She reports not using aerochamber with inhalers. She tried the following inhalers: none. Main triggers are weather changes. In the last month, frequency of symptoms: 1x/week. Frequency of nocturnal symptoms: 0x/month. Frequency of SABA use: 0x/week. Interference with physical activity: no. In the last 12 months, emergency room visits/urgent care visits/doctor office visits or hospitalizations due to respiratory issues: no. In the last 12 months, oral steroids courses: no. Lifetime history of hospitalization for respiratory issues: no. Prior intubations: no. History of pneumonia: in 2018. She was not evaluated by allergist/pulmonologist in the past. Smoking exposure: no. Up to date with flu vaccine: no. Up to date with COVID-19 vaccine: yes. Prior Covid-19 infection: 2023 and 2022. History of reflux: takes pantoprazole as needed with good benefit.  Rhinitis: She reports symptoms of nasal congestion, PND, itchy/watery eyes. Symptoms have been going on for 10 years. The symptoms are present all year around. Anosmia: no. Headache: sometimes. She has used zyrtec, Flonase with some improvement in symptoms. Sinus infections: no. Previous work up includes: none. Previous ENT  evaluation: no. Previous sinus imaging: no. History of nasal polyps: no. Last eye exam: 2023.  Skin Patient noticed itching when she is not taking any zyrtec.  Frequency of episodes: every 3 days. Suspected triggers are unknown. Denies any fevers, chills, changes in medications, foods, personal care products or recent infections.  Patient is up to date with the following cancer screening tests: physical exam, mammogram, colonoscopy, pap smear.  Assessment and Plan: [name] is a 61 y.o. female with: Moderate persistent asthma Coughing, wheezing for 5+ yrs. Triggers include weather change. Using albuterol on rare occasions. Covid-19 in 2023 and 2022. Takes PPI for GERD. Today's spirometry showed moderate obstruction with 31% improvement in FEV1 post bronchodilator treatment. Clinically feeling unchanged.  Given symptoms will start a daily ICS inhaler.  Daily controller medication(s): start Pulmicort 1 puff once a day and rinse mouth after each use. Demonstrated proper use.  May use albuterol rescue inhaler 2 puffs every 4 to 6 hours as needed for shortness of breath, chest tightness, coughing, and wheezing. Monitor frequency of use.  Get spirometry at next visit.  Other allergic rhinitis Perennial rhinoconjunctivitis symptoms for 10 years.  Uses Zyrtec and Flonase with some benefit.  No prior allergy/ENT evaluation. Today's skin prick testing showed: Positive to weed pollen. Will get bloodwork instead of intradermal as positive control was only borderline.  Start environmental control measures as below. Use over the counter antihistamines such as Zyrtec (cetirizine), Claritin (loratadine), Allegra (fexofenadine), or Xyzal (levocetirizine) daily as needed. May take twice a day during allergy flares. May switch antihistamines every few months. Start Ryaltris (olopatadine + mometasone nasal spray combination) 1-2 sprays per nostril twice a day. Sample given. This replaces your other  nasal sprays. If this works well for you,  then have [facility] ship the medication to your home - prescription already sent in.   Pruritus Noted pruritus when note on antihistamines. No specific triggers noted. See below for proper skincare. Okay to continue with zyrtec (cetirizine) 10mg  daily. Get bloodwork to rule out other etiologies.   Other adverse food reactions, not elsewhere classified, subsequent encounter Shellfish caused hives and trouble breathing in the past. Avoiding chocolate, broccoli and red meat due to worsening acid reflux. Today's skin prick testing was negative to select foods but positive control was borderline.  Continue to avoid foods that are bothersome - shellfish.  Get bloodwork.  For mild symptoms you can take over the counter antihistamines such as Benadryl and monitor symptoms closely. If symptoms worsen or if you have severe symptoms including breathing issues, throat closure, significant swelling, whole body hives, severe diarrhea and vomiting, lightheadedness then inject epinephrine and seek immediate medical care afterwards. Action plan given.  Gastroesophageal reflux disease See handout for lifestyle and dietary modifications. Take Protonix 40mg  daily in the morning. Nothing to eat or drink for 30 minutes afterwards.   Return in about 2 months (around 09/05/2022).  Meds ordered this encounter  Medications   Olopatadine-Mometasone (RYALTRIS) 665-25 MCG/ACT SUSP    Sig: Place 1-2 sprays into the nose in the morning and at bedtime.    Dispense:  29 g    Refill:  5    [phone]   budesonide (PULMICORT FLEXHALER) 180 MCG/ACT inhaler    Sig: Inhale 1 puff into the lungs daily. Rinse mouth after each use.    Dispense:  1 each    Refill:  3   Lab Orders         Allergen Profile, Shellfish         Alpha-Gal Panel         Allergens w/Total IgE Area 2         ANA w/Reflex         CBC with Differential/Platelet         Chronic Urticaria         Tryptase          Comprehensive metabolic panel         C-reactive protein         Sedimentation rate         Thyroid Cascade Profile      Other allergy screening: Food allergy: yes Currently avoiding chocolate, broccoli, red meat due to acid reflux. Shellfish caused hives and the fumes cause issues with her breathing. Past work up includes: none. Dietary History: patient has been eating other foods including milk, eggs, peanut, treenuts, sesame, fish, soy, wheat, meats, fruits and vegetables.  She reports reading labels and avoiding chocolate, broccoli, red meat, shellfish in diet completely.   Medication allergy: yes Amoxicillin - hives long time ago. Hymenoptera allergy: no Urticaria: yes When exercising Eczema:no History of recurrent infections suggestive of immunodeficency: no  Diagnostics: Spirometry:  Tracings reviewed. Her effort: Good reproducible efforts. FVC: 1.90L FEV1: 1.23L, 53% predicted FEV1/FVC ratio: 65% Interpretation: Spirometry consistent with moderate obstructive disease with 31% improvement in FEV1 post bronchodilator treatment. Clinically feeling unchanged.   Please see scanned spirometry results for details.  Skin Testing: Environmental allergy panel and select foods. Positive to weed pollen. Negative to select foods. Results discussed with patient/family.  Airborne Adult Perc - 07/06/22 0900     Time Antigen Placed 10-12-1980    Allergen Manufacturer 09/05/22    Location Back    Number of Test  59    1. Control-Buffer 50% Glycerol Negative    2. Control-Histamine 1 mg/ml --   +-   3. Albumin saline Negative    4. Bahia Negative    5. French Southern Territories Negative    6. Johnson Negative    7. Kentucky Blue Negative    8. Meadow Fescue Negative    9. Perennial Rye Negative    10. Sweet Vernal Negative    11. Timothy Negative    12. Cocklebur Negative    13. Burweed Marshelder Negative    14. Ragweed, short Negative    15. Ragweed, Giant Negative    16. Plantain,   English Negative    17. Lamb's Quarters Negative    18. Sheep Sorrell Negative    19. Rough Pigweed Negative    20. Marsh Elder, Rough Negative    21. Mugwort, Common 2+    22. Ash mix Negative    23. Birch mix Negative    24. Beech American Negative    25. Box, Elder Negative    26. Cedar, red Negative    27. Cottonwood, Guinea-Bissau Negative    28. Elm mix Negative    29. Hickory Negative    30. Maple mix Negative    31. Oak, Guinea-Bissau mix Negative    32. Pecan Pollen Negative    33. Pine mix Negative    34. Sycamore Eastern Negative    35. Walnut, Black Pollen Negative    36. Alternaria alternata Negative    37. Cladosporium Herbarum Negative    38. Aspergillus mix Negative    39. Penicillium mix Negative    40. Bipolaris sorokiniana (Helminthosporium) Negative    41. Drechslera spicifera (Curvularia) Negative    42. Mucor plumbeus Negative    43. Fusarium moniliforme Negative    44. Aureobasidium pullulans (pullulara) Negative    45. Rhizopus oryzae Negative    46. Botrytis cinera Negative    47. Epicoccum nigrum Negative    48. Phoma betae Negative    49. Candida Albicans Negative    50. Trichophyton mentagrophytes Negative    51. Mite, D Farinae  5,000 AU/ml Negative    52. Mite, D Pteronyssinus  5,000 AU/ml Negative    53. Cat Hair 10,000 BAU/ml Negative    54.  Dog Epithelia Negative    55. Mixed Feathers Negative    56. Horse Epithelia Negative    57. Cockroach, German Negative    58. Mouse Negative    59. Tobacco Leaf Negative             Food Adult Perc - 07/06/22 0900     Time Antigen Placed 3267    Allergen Manufacturer Waynette Buttery    Location Back    Number of allergen test 12    8. Shellfish Mix Negative    11. Pecan Food Negative    12. Walnut Food Negative    25. Shrimp Negative    26. Crab Negative    27. Lobster Negative    28. Oyster Negative    29. Scallops Negative    37. Pork Negative    40. Beef Negative    41. Lamb Negative    64.  Chocolate/Cacao bean Negative             Past Medical History: Patient Active Problem List   Diagnosis Date Noted   Anxiety 07/06/2022   Other allergy status, other than to drugs and biological substances 07/06/2022   Paresthesia of skin 07/06/2022  Pure hypercholesterolemia 07/06/2022   Shellfish allergy 07/06/2022   Ventricular premature depolarization 07/06/2022   Other allergic rhinitis 07/06/2022   Urticaria 07/06/2022   Other adverse food reactions, not elsewhere classified, subsequent encounter 07/06/2022   Moderate persistent asthma 07/06/2022   Pruritus 07/06/2022   Gastroesophageal reflux disease 01/31/2021   Chronic RUQ pain 09/17/2020   Small intestinal bacterial overgrowth 08/08/2019   Excessive daytime sleepiness 02/06/2019   Loud snoring 02/06/2019   Class 2 severe obesity with serious comorbidity and body mass index (BMI) of 38.0 to 38.9 in adult (HCC) 01/10/2019   Vitamin D deficiency 01/10/2019   Heart palpitations 11/11/2016   Chest pain 11/11/2016   Leg pain 11/11/2016   Essential hypertension 03/03/2014   Past Medical History:  Diagnosis Date   Chronic RUQ pain    Colon polyps    hyperplastic - some right sided   Fe deficiency anemia    Foot pain, left    Frequent headaches    GERD (gastroesophageal reflux disease)    Helicobacter pylori antibody positive    Hemorrhoids    Hypertension    IBS (irritable bowel syndrome)    Joint pain    Menopausal symptoms    Mild asthma    Obesity    Palpitations    Pneumonia    Shortness of breath    Small intestinal bacterial overgrowth 08/08/2019   Swelling of both lower extremities    Past Surgical History: Past Surgical History:  Procedure Laterality Date   ABDOMINAL HYSTERECTOMY  2005   APPENDECTOMY  1984   CERVICAL CANCER CELLS FROZEN  1991   COLONOSCOPY     2011 and 2018   DILATION AND CURETTAGE OF UTERUS  1991   ESOPHAGOGASTRODUODENOSCOPY  10/2017   Medication List:  Current Outpatient  Medications  Medication Sig Dispense Refill   albuterol (PROVENTIL HFA;VENTOLIN HFA) 108 (90 Base) MCG/ACT inhaler Inhale 2 puffs into the lungs every 6 (six) hours as needed for wheezing or shortness of breath.     ALOE VERA JUICE PO Take by mouth as needed. 2 oz prn     budesonide (PULMICORT FLEXHALER) 180 MCG/ACT inhaler Inhale 1 puff into the lungs daily. Rinse mouth after each use. 1 each 3   cetirizine (ZYRTEC) 10 MG tablet Take 10 mg by mouth daily as needed.     Cholecalciferol (VITAMIN D3) 125 MCG (5000 UT) CAPS Take 1 capsule by mouth daily.     EPINEPHrine 0.3 mg/0.3 mL IJ SOAJ injection See admin instructions.     MAGNESIUM PO Take 120 mg by mouth daily.     meloxicam (MOBIC) 15 MG tablet Take 1 tablet (15 mg total) by mouth daily. (Patient taking differently: Take 15 mg by mouth daily as needed.) 30 tablet 1   Olopatadine-Mometasone (RYALTRIS) 665-25 MCG/ACT SUSP Place 1-2 sprays into the nose in the morning and at bedtime. 29 g 5   pantoprazole (PROTONIX) 40 MG tablet Take 40 mg by mouth daily as needed.     sucralfate (CARAFATE) 1 g tablet Take 1 g by mouth 4 (four) times daily as needed.     No current facility-administered medications for this visit.   Allergies: Allergies  Allergen Reactions   Amoxicillin Hives and Swelling   Shellfish Allergy Hives    Any seafood.   Social History: Social History   Socioeconomic History   Marital status: Married    Spouse name: Casimiro NeedleMichael   Number of children: 2   Years of education: Not on file  Highest education level: Not on file  Occupational History   Occupation: HAIR STYLIST  Tobacco Use   Smoking status: Never   Smokeless tobacco: Never  Vaping Use   Vaping Use: Never used  Substance and Sexual Activity   Alcohol use: No   Drug use: No   Sexual activity: Not on file  Other Topics Concern   Not on file  Social History Narrative   Married, hair stylist   2 children   No EtOH, tobacco, drugs   Social Determinants  of Health   Financial Resource Strain: Not on file  Food Insecurity: Not on file  Transportation Needs: Not on file  Physical Activity: Not on file  Stress: Not on file  Social Connections: Not on file   Lives in a 60 year old house. Smoking: denies Occupation: Warehouse manager HistorySurveyor, minerals in the house: no Engineer, civil (consulting) in the family room: no Carpet in the bedroom: yes Heating: gas Cooling: central Pet: no  Family History: Family History  Problem Relation Age of Onset   Congestive Heart Failure Mother    Diabetes Mother    High blood pressure Mother    Obesity Mother    High blood pressure Father    Diabetes Father    Breast cancer Maternal Aunt    Breast cancer Cousin    Stomach cancer Neg Hx    Colon cancer Neg Hx    Throat cancer Neg Hx    Pancreatic cancer Neg Hx    Problem                               Relation Asthma                                   no Eczema                                no Food allergy                          no Allergic rhino conjunctivitis     no  Review of Systems  Constitutional:  Negative for appetite change, chills, fever and unexpected weight change.  HENT:  Positive for congestion and postnasal drip. Negative for rhinorrhea.   Eyes:  Positive for itching.  Respiratory:  Positive for cough and wheezing. Negative for chest tightness and shortness of breath.   Cardiovascular:  Negative for chest pain.  Gastrointestinal:  Negative for abdominal pain.  Genitourinary:  Negative for difficulty urinating.  Skin:  Negative for rash.  Allergic/Immunologic: Positive for environmental allergies.  Neurological:  Negative for headaches.    Objective: BP 130/80   Pulse 64   Temp 97.6 F (36.4 C)   Resp 18   Ht 5\' 6"  (1.676 m)   Wt 251 lb (113.9 kg)   SpO2 98%   BMI 40.51 kg/m  Body mass index is 40.51 kg/m. Physical Exam Vitals and nursing note reviewed.  Constitutional:      Appearance: Normal  appearance. She is well-developed. She is obese.  HENT:     Head: Normocephalic and atraumatic.     Right Ear: Tympanic membrane and external ear normal.     Left Ear: Tympanic membrane and external ear normal.  Nose: Congestion present.     Mouth/Throat:     Mouth: Mucous membranes are moist.     Pharynx: Oropharynx is clear.  Eyes:     Conjunctiva/sclera: Conjunctivae normal.  Cardiovascular:     Rate and Rhythm: Normal rate and regular rhythm.     Heart sounds: Normal heart sounds. No murmur heard.    No friction rub. No gallop.  Pulmonary:     Effort: Pulmonary effort is normal.     Breath sounds: Normal breath sounds. No wheezing, rhonchi or rales.  Musculoskeletal:     Cervical back: Neck supple.  Skin:    General: Skin is warm.     Findings: No rash.  Neurological:     Mental Status: She is alert and oriented to person, place, and time.  Psychiatric:        Behavior: Behavior normal.   The plan was reviewed with the patient/family, and all questions/concerned were addressed.  It was my pleasure to see [name] today and participate in her care. Please feel free to contact me with any questions or concerns.  Sincerely,  Wyline Mood, DO Allergy & Immunology  [facility] office: [phone] [facility] office: [phone]

## 2022-07-06 ENCOUNTER — Encounter: Payer: Self-pay | Admitting: Allergy

## 2022-07-06 ENCOUNTER — Ambulatory Visit (INDEPENDENT_AMBULATORY_CARE_PROVIDER_SITE_OTHER): Payer: No Typology Code available for payment source | Admitting: Allergy

## 2022-07-06 VITALS — BP 130/80 | HR 64 | Temp 97.6°F | Resp 18 | Ht 66.0 in | Wt 251.0 lb

## 2022-07-06 DIAGNOSIS — I493 Ventricular premature depolarization: Secondary | ICD-10-CM | POA: Insufficient documentation

## 2022-07-06 DIAGNOSIS — K9049 Malabsorption due to intolerance, not elsewhere classified: Secondary | ICD-10-CM

## 2022-07-06 DIAGNOSIS — J4540 Moderate persistent asthma, uncomplicated: Secondary | ICD-10-CM | POA: Diagnosis not present

## 2022-07-06 DIAGNOSIS — L299 Pruritus, unspecified: Secondary | ICD-10-CM

## 2022-07-06 DIAGNOSIS — K219 Gastro-esophageal reflux disease without esophagitis: Secondary | ICD-10-CM

## 2022-07-06 DIAGNOSIS — L509 Urticaria, unspecified: Secondary | ICD-10-CM

## 2022-07-06 DIAGNOSIS — F419 Anxiety disorder, unspecified: Secondary | ICD-10-CM | POA: Insufficient documentation

## 2022-07-06 DIAGNOSIS — R202 Paresthesia of skin: Secondary | ICD-10-CM | POA: Insufficient documentation

## 2022-07-06 DIAGNOSIS — J45909 Unspecified asthma, uncomplicated: Secondary | ICD-10-CM | POA: Insufficient documentation

## 2022-07-06 DIAGNOSIS — E7800 Pure hypercholesterolemia, unspecified: Secondary | ICD-10-CM | POA: Insufficient documentation

## 2022-07-06 DIAGNOSIS — Z9109 Other allergy status, other than to drugs and biological substances: Secondary | ICD-10-CM | POA: Insufficient documentation

## 2022-07-06 DIAGNOSIS — J3089 Other allergic rhinitis: Secondary | ICD-10-CM | POA: Diagnosis not present

## 2022-07-06 DIAGNOSIS — T781XXD Other adverse food reactions, not elsewhere classified, subsequent encounter: Secondary | ICD-10-CM

## 2022-07-06 DIAGNOSIS — Z91013 Allergy to seafood: Secondary | ICD-10-CM | POA: Insufficient documentation

## 2022-07-06 MED ORDER — RYALTRIS 665-25 MCG/ACT NA SUSP: 1.0000 | Freq: Two times a day (BID) | NASAL | 5 refills | Status: DC

## 2022-07-06 MED ORDER — PULMICORT FLEXHALER 180 MCG/ACT IN AEPB: 1.0000 | INHALATION_SPRAY | Freq: Every day | RESPIRATORY_TRACT | 3 refills | Status: DC

## 2022-07-06 NOTE — Assessment & Plan Note (Signed)
Noted pruritus when note on antihistamines. No specific triggers noted. . See below for proper skincare. Molli Knock to continue with zyrtec (cetirizine) 10mg  daily. . Get bloodwork to rule out other etiologies.

## 2022-07-06 NOTE — Assessment & Plan Note (Signed)
   See handout for lifestyle and dietary modifications.  Take Protonix 40mg  daily in the morning. Nothing to eat or drink for 30 minutes afterwards.

## 2022-07-06 NOTE — Patient Instructions (Addendum)
Today's skin testing showed: Positive to weed pollen. Negative to select foods.  Results given.  Asthma Daily controller medication(s): start Pulmicort 1 puff once a day and rinse mouth after each use.  May use albuterol rescue inhaler 2 puffs every 4 to 6 hours as needed for shortness of breath, chest tightness, coughing, and wheezing. Monitor frequency of use.  Asthma control goals:  Full participation in all desired activities (may need albuterol before activity) Albuterol use two times or less a week on average (not counting use with activity) Cough interfering with sleep two times or less a month Oral steroids no more than once a year No hospitalizations   Environmental allergies Start environmental control measures as below. Get bloodwork as your skin testing was not very reactive.  Use over the counter antihistamines such as Zyrtec (cetirizine), Claritin (loratadine), Allegra (fexofenadine), or Xyzal (levocetirizine) daily as needed. May take twice a day during allergy flares. May switch antihistamines every few months. Start Ryaltris (olopatadine + mometasone nasal spray combination) 1-2 sprays per nostril twice a day. Sample given. This replaces your other nasal sprays. If this works well for you, then have [facility] ship the medication to your home - prescription already sent in.   Itching See below for proper skincare. Okay to continue with zyrtec (cetirizine) 10mg  daily.  Food Continue to avoid foods that are bothersome - shellfish.  Get bloodwork.  For mild symptoms you can take over the counter antihistamines such as Benadryl and monitor symptoms closely. If symptoms worsen or if you have severe symptoms including breathing issues, throat closure, significant swelling, whole body hives, severe diarrhea and vomiting, lightheadedness then inject epinephrine and seek immediate medical care afterwards. Action plan given.  Heartburn: See handout for lifestyle and  dietary modifications. Take Protonix 40mg  daily in the morning. Nothing to eat or drink for 30 minutes afterwards.   Follow up in 2 months or sooner if needed.    Skin care recommendations  Bath time: Always use lukewarm water. AVOID very hot or cold water. Keep bathing time to 5-10 minutes. Do NOT use bubble bath. Use a mild soap and use just enough to wash the dirty areas. Do NOT scrub skin vigorously.  After bathing, pat dry your skin with a towel. Do NOT rub or scrub the skin.  Moisturizers and prescriptions:  ALWAYS apply moisturizers immediately after bathing (within 3 minutes). This helps to lock-in moisture. Use the moisturizer several times a day over the whole body. Good summer moisturizers include: Aveeno, CeraVe, Cetaphil. Good winter moisturizers include: Aquaphor, Vaseline, Cerave, Cetaphil, Eucerin, Vanicream. When using moisturizers along with medications, the moisturizer should be applied about one hour after applying the medication to prevent diluting effect of the medication or moisturize around where you applied the medications. When not using medications, the moisturizer can be continued twice daily as maintenance.  Laundry and clothing: Avoid laundry products with added color or perfumes. Use unscented hypo-allergenic laundry products such as Tide free, Cheer free & gentle, and All free and clear.  If the skin still seems dry or sensitive, you can try double-rinsing the clothes. Avoid tight or scratchy clothing such as wool. Do not use fabric softeners or dyer sheets.   Reducing Pollen Exposure Pollen seasons: trees (spring), grass (summer) and ragweed/weeds (fall). Keep windows closed in your home and car to lower pollen exposure.  Install air conditioning in the bedroom and throughout the house if possible.  Avoid going out in dry windy days - especially early  morning. Pollen counts are highest between 5 - 10 AM and on dry, hot and windy days.  Save outside  activities for late afternoon or after a heavy rain, when pollen levels are lower.  Avoid mowing of grass if you have grass pollen allergy. Be aware that pollen can also be transported indoors on people and pets.  Dry your clothes in an automatic dryer rather than hanging them outside where they might collect pollen.  Rinse hair and eyes before bedtime.

## 2022-07-06 NOTE — Assessment & Plan Note (Signed)
Coughing, wheezing for 5+ yrs. Triggers include weather change. Using albuterol on rare occasions. Covid-19 in 2023 and 2022. Takes PPI for GERD.  Today's spirometry showed moderate obstruction with 31% improvement in FEV1 post bronchodilator treatment. Clinically feeling unchanged.  . Given symptoms will start a daily ICS inhaler.  . Daily controller medication(s): start Pulmicort 1 puff once a day and rinse mouth after each use. Demonstrated proper use.  . May use albuterol rescue inhaler 2 puffs every 4 to 6 hours as needed for shortness of breath, chest tightness, coughing, and wheezing. Monitor frequency of use.  . Get spirometry at next visit.

## 2022-07-06 NOTE — Assessment & Plan Note (Signed)
Perennial rhinoconjunctivitis symptoms for 10 years.  Uses Zyrtec and Flonase with some benefit.  No prior allergy/ENT evaluation.  Today's skin prick testing showed: Positive to weed pollen.  Will get bloodwork instead of intradermal as positive control was only borderline.   Start environmental control measures as below.  Use over the counter antihistamines such as Zyrtec (cetirizine), Claritin (loratadine), Allegra (fexofenadine), or Xyzal (levocetirizine) daily as needed. May take twice a day during allergy flares. May switch antihistamines every few months.  Start Ryaltris (olopatadine + mometasone nasal spray combination) 1-2 sprays per nostril twice a day. Sample given.  This replaces your other nasal sprays.  If this works well for you, then have [facility] ship the medication to your home - prescription already sent in.

## 2022-07-06 NOTE — Assessment & Plan Note (Signed)
Shellfish caused hives and trouble breathing in the past. Avoiding chocolate, broccoli and red meat due to worsening acid reflux.  Today's skin prick testing was negative to select foods but positive control was borderline.  . Continue to avoid foods that are bothersome - shellfish.  . Get bloodwork.  . For mild symptoms you can take over the counter antihistamines such as Benadryl and monitor symptoms closely. If symptoms worsen or if you have severe symptoms including breathing issues, throat closure, significant swelling, whole body hives, severe diarrhea and vomiting, lightheadedness then inject epinephrine and seek immediate medical care afterwards. . Action plan given.

## 2022-07-15 ENCOUNTER — Encounter: Payer: Self-pay | Admitting: Allergy

## 2022-07-15 LAB — CBC WITH DIFFERENTIAL/PLATELET
Basophils Absolute: 0 10*3/uL (ref 0.0–0.2)
Basos: 0 %
EOS (ABSOLUTE): 0.1 10*3/uL (ref 0.0–0.4)
Eos: 1 %
Hematocrit: 39.2 % (ref 34.0–46.6)
Hemoglobin: 12.9 g/dL (ref 11.1–15.9)
Immature Grans (Abs): 0 10*3/uL (ref 0.0–0.1)
Immature Granulocytes: 0 %
Lymphocytes Absolute: 1.8 10*3/uL (ref 0.7–3.1)
Lymphs: 28 %
MCH: 29.7 pg (ref 26.6–33.0)
MCHC: 32.9 g/dL (ref 31.5–35.7)
MCV: 90 fL (ref 79–97)
Monocytes Absolute: 0.3 10*3/uL (ref 0.1–0.9)
Monocytes: 4 %
Neutrophils Absolute: 4.1 10*3/uL (ref 1.4–7.0)
Neutrophils: 67 %
Platelets: 297 10*3/uL (ref 150–450)
RBC: 4.35 x10E6/uL (ref 3.77–5.28)
RDW: 13 % (ref 11.7–15.4)
WBC: 6.2 10*3/uL (ref 3.4–10.8)

## 2022-07-15 LAB — ALLERGENS W/TOTAL IGE AREA 2
Alternaria Alternata IgE: <0.1 kU/L
Aspergillus Fumigatus IgE: <0.1 kU/L
Bermuda Grass IgE: <0.1 kU/L
Cat Dander IgE: <0.1 kU/L
Cedar, Mountain IgE: 2 kU/L — AB
Cladosporium Herbarum IgE: <0.1 kU/L
Cockroach, German IgE: 64.5 kU/L — AB
Common Silver Birch IgE: <0.1 kU/L
Cottonwood IgE: 0.16 kU/L — AB
D Farinae IgE: 30 kU/L — AB
D Pteronyssinus IgE: 16.5 kU/L — AB
Dog Dander IgE: 0.11 kU/L — AB
Elm, American IgE: <0.1 kU/L
Johnson Grass IgE: <0.1 kU/L
Maple/Box Elder IgE: <0.1 kU/L
Mouse Urine IgE: <0.1 kU/L
Oak, White IgE: <0.1 kU/L
Pecan, Hickory IgE: <0.1 kU/L
Penicillium Chrysogen IgE: <0.1 kU/L
Pigweed, Rough IgE: <0.1 kU/L
Ragweed, Short IgE: 3.02 kU/L — AB
Sheep Sorrel IgE Qn: <0.1 kU/L
Timothy Grass IgE: 0.43 kU/L — AB
White Mulberry IgE: <0.1 kU/L

## 2022-07-15 LAB — COMPREHENSIVE METABOLIC PANEL
ALT: 12 IU/L (ref 0–32)
AST: 10 IU/L (ref 0–40)
Albumin/Globulin Ratio: 1.4 (ref 1.2–2.2)
Albumin: 4.2 g/dL (ref 3.8–4.9)
Alkaline Phosphatase: 67 IU/L (ref 44–121)
BUN/Creatinine Ratio: 17 (ref 9–23)
BUN: 11 mg/dL (ref 6–24)
Bilirubin Total: 0.6 mg/dL (ref 0.0–1.2)
CO2: 28 mmol/L (ref 20–29)
Calcium: 9 mg/dL (ref 8.7–10.2)
Chloride: 101 mmol/L (ref 96–106)
Creatinine, Ser: 0.64 mg/dL (ref 0.57–1.00)
Globulin, Total: 3 g/dL (ref 1.5–4.5)
Glucose: 102 mg/dL — ABNORMAL HIGH (ref 70–99)
Potassium: 4.2 mmol/L (ref 3.5–5.2)
Sodium: 141 mmol/L (ref 134–144)
Total Protein: 7.2 g/dL (ref 6.0–8.5)
eGFR: 102 mL/min/{1.73_m2} (ref >59)

## 2022-07-15 LAB — ENA+DNA/DS+SJORGEN'S
ENA RNP Ab: >8 AI — ABNORMAL HIGH (ref 0.0–0.9)
ENA SM Ab Ser-aCnc: <0.2 AI (ref 0.0–0.9)
ENA SSA (RO) Ab: <0.2 AI (ref 0.0–0.9)
ENA SSB (LA) Ab: <0.2 AI (ref 0.0–0.9)
dsDNA Ab: 2 IU/mL (ref 0–9)

## 2022-07-15 LAB — ALLERGEN PROFILE, SHELLFISH
Clam IgE: 41.2 kU/L — AB
F023-IgE Crab: >100 kU/L — AB
F080-IgE Lobster: >100 kU/L — AB
F290-IgE Oyster: 16.5 kU/L — AB
Scallop IgE: 71.2 kU/L — AB
Shrimp IgE: >100 kU/L — AB

## 2022-07-15 LAB — C-REACTIVE PROTEIN: CRP: 14 mg/L — ABNORMAL HIGH (ref 0–10)

## 2022-07-15 LAB — CHRONIC URTICARIA: cu index: 5.6 (ref <10)

## 2022-07-15 LAB — SEDIMENTATION RATE: Sed Rate: 38 mm/hr (ref 0–40)

## 2022-07-15 LAB — ALPHA-GAL PANEL
Allergen Lamb IgE: <0.1 kU/L
Beef IgE: <0.1 kU/L
IgE (Immunoglobulin E), Serum: 249 IU/mL (ref 6–495)
O215-IgE Alpha-Gal: <0.1 kU/L
Pork IgE: <0.1 kU/L

## 2022-07-15 LAB — TRYPTASE: Tryptase: 5.6 ug/L (ref 2.2–13.2)

## 2022-07-15 LAB — THYROID CASCADE PROFILE: TSH: 2.06 u[IU]/mL (ref 0.450–4.500)

## 2022-07-15 LAB — ANA W/REFLEX: Anti Nuclear Antibody (ANA): POSITIVE — AB

## 2022-07-15 MED ORDER — FLUTICASONE PROPIONATE HFA 110 MCG/ACT IN AERO: 2.0000 | INHALATION_SPRAY | Freq: Two times a day (BID) | RESPIRATORY_TRACT | 12 refills | Status: DC

## 2022-07-15 MED ORDER — AEROCHAMBER MV MISC: 2 refills | Status: DC

## 2022-07-17 ENCOUNTER — Telehealth: Payer: Self-pay | Admitting: *Deleted

## 2022-07-17 ENCOUNTER — Other Ambulatory Visit: Payer: Self-pay | Admitting: *Deleted

## 2022-07-17 NOTE — Telephone Encounter (Signed)
Flovent HFA 100 mcg/act PA...  KEYElenora Fender PA case ID: [id number] - Rx#: [id number] created: 07/17/22  PA has been sent to [facility] plan for review.  Called patient- LMOVM that status update regarding Flovent HFA PA is on her myChart.  Forwarding message to provider as update.

## 2022-07-17 NOTE — Telephone Encounter (Signed)
We tried Pulmicort first but it caused side effects.  Can we do a PA for Flovent?

## 2022-07-17 NOTE — Telephone Encounter (Signed)
Insurance states that Flovent is not covered and that preferred alternative is Pulmicort. Would you like to change the inhaler?

## 2022-07-20 NOTE — Telephone Encounter (Signed)
PA has been approved for Flovent. PA has been faxed to patients pharmacy, labeled, and placed in bulk scanning.

## 2022-09-06 NOTE — Progress Notes (Deleted)
Follow Up Note  RE: [name] MRN: [id number] DOB: May 04, 1961 Date of Office Visit: 09/07/2022  Referring provider: [name], MD Primary care provider: [name], MD  Chief Complaint: No chief complaint on file.  History of Present Illness: I had the pleasure of seeing [name] for a follow up visit at the [facility] on 09/06/2022. She is a 61 y.o. female, who is being followed for asthma, allergic rhinoconjunctivitis, pruritus, adverse food reaction and GERD. Her previous allergy office visit was on 07/06/2022 with Dr. Selena [name]. Today is a regular follow up visit.  Environmental allergy panel was positive to dust mites, grass, cockroach, trees, ragweed. Food panel positive to shellfish and mollusks.  I reviewed the bloodwork. Blood count, kidney function, liver function, electrolytes, thyroid,  inflammation markers, chronic urticaria index (checks for autoantibodies that trigger mast cells), tryptase (checks for mast cell issues) and alpha gal (checks for red meat allergy) were all normal which is great.   ANA pos with RNP ab - refer to rheum    Moderate persistent asthma Coughing, wheezing for 5+ yrs. Triggers include weather change. Using albuterol on rare occasions. Covid-19 in 2023 and 2022. Takes PPI for GERD. Today's spirometry showed moderate obstruction with 31% improvement in FEV1 post bronchodilator treatment. Clinically feeling unchanged.  Given symptoms will start a daily ICS inhaler.  Daily controller medication(s): start Pulmicort 1 puff once a day and rinse mouth after each use. Demonstrated proper use.  May use albuterol rescue inhaler 2 puffs every 4 to 6 hours as needed for shortness of breath, chest tightness, coughing, and wheezing. Monitor frequency of use.  Get spirometry at next visit.   Other allergic rhinitis Perennial rhinoconjunctivitis symptoms for 10 years.  Uses Zyrtec and Flonase with some benefit.  No prior  allergy/ENT evaluation. Today's skin prick testing showed: Positive to weed pollen. Will get bloodwork instead of intradermal as positive control was only borderline.  Start environmental control measures as below. Use over the counter antihistamines such as Zyrtec (cetirizine), Claritin (loratadine), Allegra (fexofenadine), or Xyzal (levocetirizine) daily as needed. May take twice a day during allergy flares. May switch antihistamines every few months. Start Ryaltris (olopatadine + mometasone nasal spray combination) 1-2 sprays per nostril twice a day. Sample given. This replaces your other nasal sprays. If this works well for you, then have [facility] ship the medication to your home - prescription already sent in.    Pruritus Noted pruritus when note on antihistamines. No specific triggers noted. See below for proper skincare. Okay to continue with zyrtec (cetirizine) 10mg  daily. Get bloodwork to rule out other etiologies.    Other adverse food reactions, not elsewhere classified, subsequent encounter Shellfish caused hives and trouble breathing in the past. Avoiding chocolate, broccoli and red meat due to worsening acid reflux. Today's skin prick testing was negative to select foods but positive control was borderline.  Continue to avoid foods that are bothersome - shellfish.  Get bloodwork.  For mild symptoms you can take over the counter antihistamines such as Benadryl and monitor symptoms closely. If symptoms worsen or if you have severe symptoms including breathing issues, throat closure, significant swelling, whole body hives, severe diarrhea and vomiting, lightheadedness then inject epinephrine and seek immediate medical care afterwards. Action plan given.   Gastroesophageal reflux disease See handout for lifestyle and dietary modifications. Take Protonix 40mg  daily in the morning. Nothing to eat or drink for 30 minutes afterwards.    Return in about  2 months (around  09/05/2022).  Assessment and Plan: [name] is a 61 y.o. female with: No problem-specific Assessment & Plan notes found for this encounter.  No follow-ups on file.  No orders of the defined types were placed in this encounter.  Lab Orders  No laboratory test(s) ordered today    Diagnostics: Spirometry:  Tracings reviewed. Her effort: {Blank single:19197::"Good reproducible efforts.","It was hard to get consistent efforts and there is a question as to whether this reflects a maximal maneuver.","Poor effort, data can not be interpreted."} FVC: ***L FEV1: ***L, ***% predicted FEV1/FVC ratio: ***% Interpretation: {Blank single:19197::"Spirometry consistent with mild obstructive disease","Spirometry consistent with moderate obstructive disease","Spirometry consistent with severe obstructive disease","Spirometry consistent with possible restrictive disease","Spirometry consistent with mixed obstructive and restrictive disease","Spirometry uninterpretable due to technique","Spirometry consistent with normal pattern","No overt abnormalities noted given today's efforts"}.  Please see scanned spirometry results for details.  Skin Testing: {Blank single:19197::"Select foods","Environmental allergy panel","Environmental allergy panel and select foods","Food allergy panel","None","Deferred due to recent antihistamines use"}. *** Results discussed with patient/family.   Medication List:  Current Outpatient Medications  Medication Sig Dispense Refill   albuterol (PROVENTIL HFA;VENTOLIN HFA) 108 (90 Base) MCG/ACT inhaler Inhale 2 puffs into the lungs every 6 (six) hours as needed for wheezing or shortness of breath.     ALOE VERA JUICE PO Take by mouth as needed. 2 oz prn     cetirizine (ZYRTEC) 10 MG tablet Take 10 mg by mouth daily as needed.     Cholecalciferol (VITAMIN D3) 125 MCG (5000 UT) CAPS Take 1 capsule by mouth daily.     EPINEPHrine 0.3 mg/0.3 mL IJ SOAJ injection See admin instructions.      fluticasone (FLOVENT HFA) 110 MCG/ACT inhaler Inhale 2 puffs into the lungs in the morning and at bedtime. with spacer and rinse mouth afterwards. 1 each 12   MAGNESIUM PO Take 120 mg by mouth daily.     meloxicam (MOBIC) 15 MG tablet Take 1 tablet (15 mg total) by mouth daily. (Patient taking differently: Take 15 mg by mouth daily as needed.) 30 tablet 1   Olopatadine-Mometasone (RYALTRIS) 665-25 MCG/ACT SUSP Place 1-2 sprays into the nose in the morning and at bedtime. 29 g 5   pantoprazole (PROTONIX) 40 MG tablet Take 40 mg by mouth daily as needed.     Spacer/Aero-Holding Chambers (AEROCHAMBER MV) inhaler Use as instructed 1 each 2   sucralfate (CARAFATE) 1 g tablet Take 1 g by mouth 4 (four) times daily as needed.     No current facility-administered medications for this visit.   Allergies: Allergies  Allergen Reactions   Amoxicillin Hives and Swelling   Shellfish Allergy Hives    Any seafood.   I reviewed her past medical history, social history, family history, and environmental history and no significant changes have been reported from her previous visit.  Review of Systems  Constitutional:  Negative for appetite change, chills, fever and unexpected weight change.  HENT:  Positive for congestion and postnasal drip. Negative for rhinorrhea.   Eyes:  Positive for itching.  Respiratory:  Positive for cough and wheezing. Negative for chest tightness and shortness of breath.   Cardiovascular:  Negative for chest pain.  Gastrointestinal:  Negative for abdominal pain.  Genitourinary:  Negative for difficulty urinating.  Skin:  Negative for rash.  Allergic/Immunologic: Positive for environmental allergies and food allergies.  Neurological:  Negative for headaches.    Objective: There were no vitals taken for this visit. There is no height or  weight on file to calculate BMI. Physical Exam Vitals and nursing note reviewed.  Constitutional:      Appearance: Normal appearance.  She is well-developed. She is obese.  HENT:     Head: Normocephalic and atraumatic.     Right Ear: Tympanic membrane and external ear normal.     Left Ear: Tympanic membrane and external ear normal.     Nose: Congestion present.     Mouth/Throat:     Mouth: Mucous membranes are moist.     Pharynx: Oropharynx is clear.  Eyes:     Conjunctiva/sclera: Conjunctivae normal.  Cardiovascular:     Rate and Rhythm: Normal rate and regular rhythm.     Heart sounds: Normal heart sounds. No murmur heard.    No friction rub. No gallop.  Pulmonary:     Effort: Pulmonary effort is normal.     Breath sounds: Normal breath sounds. No wheezing, rhonchi or rales.  Musculoskeletal:     Cervical back: Neck supple.  Skin:    General: Skin is warm.     Findings: No rash.  Neurological:     Mental Status: She is alert and oriented to person, place, and time.  Psychiatric:        Behavior: Behavior normal.    Previous notes and tests were reviewed. The plan was reviewed with the patient/family, and all questions/concerned were addressed.  It was my pleasure to see [name] today and participate in her care. Please feel free to contact me with any questions or concerns.  Sincerely,  Wyline Mood, DO Allergy & Immunology  [facility] office: [phone] [facility] office: [phone]

## 2022-09-07 ENCOUNTER — Ambulatory Visit: Payer: No Typology Code available for payment source | Admitting: Allergy

## 2022-09-07 DIAGNOSIS — K219 Gastro-esophageal reflux disease without esophagitis: Secondary | ICD-10-CM

## 2022-09-07 DIAGNOSIS — H1010 Acute atopic conjunctivitis, unspecified eye: Secondary | ICD-10-CM

## 2022-09-07 DIAGNOSIS — L509 Urticaria, unspecified: Secondary | ICD-10-CM

## 2022-09-07 DIAGNOSIS — J4540 Moderate persistent asthma, uncomplicated: Secondary | ICD-10-CM

## 2022-09-07 DIAGNOSIS — T7800XD Anaphylactic reaction due to unspecified food, subsequent encounter: Secondary | ICD-10-CM

## 2022-09-27 NOTE — Progress Notes (Unsigned)
Follow Up Note  RE: [name] MRN: [id number] DOB: [birth year] Date of Office Visit: 09/28/2022  Referring provider: [name], MD Primary care provider: [name], MD  Chief Complaint: No chief complaint on file.  History of Present Illness: I had the pleasure of seeing [name] for a follow up visit at the [facility] on 09/27/2022. She is a 61 y.o. female, who is being followed for asthma, allergic rhinitis, pruritus, adverse food reaction and GERD. Her previous allergy office visit was on 07/06/2022 with Dr. Selena Batten. Today is a regular follow up visit.  Moderate persistent asthma Coughing, wheezing for 5+ yrs. Triggers include weather change. Using albuterol on rare occasions. Covid-19 in 2023 and 2022. Takes PPI for GERD. Today's spirometry showed moderate obstruction with 31% improvement in FEV1 post bronchodilator treatment. Clinically feeling unchanged.  Given symptoms will start a daily ICS inhaler.  Daily controller medication(s): start Pulmicort 1 puff once a day and rinse mouth after each use. Demonstrated proper use.  May use albuterol rescue inhaler 2 puffs every 4 to 6 hours as needed for shortness of breath, chest tightness, coughing, and wheezing. Monitor frequency of use.  Get spirometry at next visit.   Other allergic rhinitis Perennial rhinoconjunctivitis symptoms for 10 years.  Uses Zyrtec and Flonase with some benefit.  No prior allergy/ENT evaluation. Today's skin prick testing showed: Positive to weed pollen. Will get bloodwork instead of intradermal as positive control was only borderline.  Start environmental control measures as below. Use over the counter antihistamines such as Zyrtec (cetirizine), Claritin (loratadine), Allegra (fexofenadine), or Xyzal (levocetirizine) daily as needed. May take twice a day during allergy flares. May switch antihistamines every few months. Start Ryaltris (olopatadine + mometasone nasal  spray combination) 1-2 sprays per nostril twice a day. Sample given. This replaces your other nasal sprays. If this works well for you, then have [facility] ship the medication to your home - prescription already sent in.    Pruritus Noted pruritus when note on antihistamines. No specific triggers noted. See below for proper skincare. Okay to continue with zyrtec (cetirizine) 10mg  daily. Get bloodwork to rule out other etiologies.    Other adverse food reactions, not elsewhere classified, subsequent encounter Shellfish caused hives and trouble breathing in the past. Avoiding chocolate, broccoli and red meat due to worsening acid reflux. Today's skin prick testing was negative to select foods but positive control was borderline.  Continue to avoid foods that are bothersome - shellfish.  Get bloodwork.  For mild symptoms you can take over the counter antihistamines such as Benadryl and monitor symptoms closely. If symptoms worsen or if you have severe symptoms including breathing issues, throat closure, significant swelling, whole body hives, severe diarrhea and vomiting, lightheadedness then inject epinephrine and seek immediate medical care afterwards. Action plan given.   Gastroesophageal reflux disease See handout for lifestyle and dietary modifications. Take Protonix 40mg  daily in the morning. Nothing to eat or drink for 30 minutes afterwards.    I reviewed the bloodwork.   Shellfish panel was positive - continue strict avoidance of all shellfish and mollusks. Environmental panel was positive to dust mites, grass, cockroach, trees, ragweed. See below for environmental control measures.    Autoimmune screener was positive with elevated RNP antibody - this sometimes can be seen in rheumatological conditions. I will refer you to rheumatology for further work up.   Blood count, kidney function, liver function, electrolytes, thyroid, inflammation markers, chronic urticaria index (checks  for  autoantibodies that trigger mast cells), tryptase (checks for mast cell issues) and alpha gal (checks for red meat allergy) were all normal which is great.    Keep appointment in October.   Assessment and Plan: [name] is a 61 y.o. female with: No problem-specific Assessment & Plan notes found for this encounter.  No follow-ups on file.  No orders of the defined types were placed in this encounter.  Lab Orders  No laboratory test(s) ordered today    Diagnostics: Spirometry:  Tracings reviewed. Her effort: {Blank single:19197::"Good reproducible efforts.","It was hard to get consistent efforts and there is a question as to whether this reflects a maximal maneuver.","Poor effort, data can not be interpreted."} FVC: ***L FEV1: ***L, ***% predicted FEV1/FVC ratio: ***% Interpretation: {Blank single:19197::"Spirometry consistent with mild obstructive disease","Spirometry consistent with moderate obstructive disease","Spirometry consistent with severe obstructive disease","Spirometry consistent with possible restrictive disease","Spirometry consistent with mixed obstructive and restrictive disease","Spirometry uninterpretable due to technique","Spirometry consistent with normal pattern","No overt abnormalities noted given today's efforts"}.  Please see scanned spirometry results for details.  Skin Testing: {Blank single:19197::"Select foods","Environmental allergy panel","Environmental allergy panel and select foods","Food allergy panel","None","Deferred due to recent antihistamines use"}. *** Results discussed with patient/family.   Medication List:  Current Outpatient Medications  Medication Sig Dispense Refill  . albuterol (PROVENTIL HFA;VENTOLIN HFA) 108 (90 Base) MCG/ACT inhaler Inhale 2 puffs into the lungs every 6 (six) hours as needed for wheezing or shortness of breath.    . ALOE VERA JUICE PO Take by mouth as needed. 2 oz prn    . cetirizine (ZYRTEC) 10 MG tablet Take 10 mg by  mouth daily as needed.    . Cholecalciferol (VITAMIN D3) 125 MCG (5000 UT) CAPS Take 1 capsule by mouth daily.    Marland Kitchen EPINEPHrine 0.3 mg/0.3 mL IJ SOAJ injection See admin instructions.    . fluticasone (FLOVENT HFA) 110 MCG/ACT inhaler Inhale 2 puffs into the lungs in the morning and at bedtime. with spacer and rinse mouth afterwards. 1 each 12  . MAGNESIUM PO Take 120 mg by mouth daily.    . meloxicam (MOBIC) 15 MG tablet Take 1 tablet (15 mg total) by mouth daily. (Patient taking differently: Take 15 mg by mouth daily as needed.) 30 tablet 1  . Olopatadine-Mometasone (RYALTRIS) [id number] MCG/ACT SUSP Place 1-2 sprays into the nose in the morning and at bedtime. 29 g 5  . pantoprazole (PROTONIX) 40 MG tablet Take 40 mg by mouth daily as needed.    Marland Kitchen Spacer/Aero-Holding Chambers (AEROCHAMBER MV) inhaler Use as instructed 1 each 2  . sucralfate (CARAFATE) 1 g tablet Take 1 g by mouth 4 (four) times daily as needed.     No current facility-administered medications for this visit.   Allergies: Allergies  Allergen Reactions  . Amoxicillin Hives and Swelling  . Shellfish Allergy Hives    Any seafood.   I reviewed her past medical history, social history, family history, and environmental history and no significant changes have been reported from her previous visit.  Review of Systems  Constitutional:  Negative for appetite change, chills, fever and unexpected weight change.  HENT:  Positive for congestion and postnasal drip. Negative for rhinorrhea.   Eyes:  Positive for itching.  Respiratory:  Positive for cough and wheezing. Negative for chest tightness and shortness of breath.   Cardiovascular:  Negative for chest pain.  Gastrointestinal:  Negative for abdominal pain.  Genitourinary:  Negative for difficulty urinating.  Skin:  Negative for rash.  Allergic/Immunologic:  Positive for environmental allergies and food allergies.  Neurological:  Negative for headaches.   Objective: There were  no vitals taken for this visit. There is no height or weight on file to calculate BMI. Physical Exam Vitals and nursing note reviewed.  Constitutional:      Appearance: Normal appearance. She is well-developed. She is obese.  HENT:     Head: Normocephalic and atraumatic.     Right Ear: Tympanic membrane and external ear normal.     Left Ear: Tympanic membrane and external ear normal.     Nose: Congestion present.     Mouth/Throat:     Mouth: Mucous membranes are moist.     Pharynx: Oropharynx is clear.  Eyes:     Conjunctiva/sclera: Conjunctivae normal.  Cardiovascular:     Rate and Rhythm: Normal rate and regular rhythm.     Heart sounds: Normal heart sounds. No murmur heard.    No friction rub. No gallop.  Pulmonary:     Effort: Pulmonary effort is normal.     Breath sounds: Normal breath sounds. No wheezing, rhonchi or rales.  Musculoskeletal:     Cervical back: Neck supple.  Skin:    General: Skin is warm.     Findings: No rash.  Neurological:     Mental Status: She is alert and oriented to person, place, and time.  Psychiatric:        Behavior: Behavior normal.  Previous notes and tests were reviewed. The plan was reviewed with the patient/family, and all questions/concerned were addressed.  It was my pleasure to see [name] today and participate in her care. Please feel free to contact me with any questions or concerns.  Sincerely,  Rexene Alberts, DO Allergy & Immunology  [facility] office: [place] office: [phone]

## 2022-09-28 ENCOUNTER — Other Ambulatory Visit: Payer: Self-pay | Admitting: Allergy

## 2022-09-28 ENCOUNTER — Encounter: Payer: Self-pay | Admitting: Allergy

## 2022-09-28 ENCOUNTER — Ambulatory Visit (INDEPENDENT_AMBULATORY_CARE_PROVIDER_SITE_OTHER): Payer: No Typology Code available for payment source | Admitting: Allergy

## 2022-09-28 ENCOUNTER — Other Ambulatory Visit: Payer: Self-pay

## 2022-09-28 VITALS — BP 122/80 | HR 73 | Temp 97.4°F | Resp 18 | Ht 66.0 in | Wt 246.2 lb

## 2022-09-28 DIAGNOSIS — J4540 Moderate persistent asthma, uncomplicated: Secondary | ICD-10-CM | POA: Diagnosis not present

## 2022-09-28 DIAGNOSIS — H1010 Acute atopic conjunctivitis, unspecified eye: Secondary | ICD-10-CM | POA: Insufficient documentation

## 2022-09-28 DIAGNOSIS — T7800XD Anaphylactic reaction due to unspecified food, subsequent encounter: Secondary | ICD-10-CM | POA: Diagnosis not present

## 2022-09-28 DIAGNOSIS — J302 Other seasonal allergic rhinitis: Secondary | ICD-10-CM

## 2022-09-28 DIAGNOSIS — L299 Pruritus, unspecified: Secondary | ICD-10-CM

## 2022-09-28 DIAGNOSIS — H1013 Acute atopic conjunctivitis, bilateral: Secondary | ICD-10-CM

## 2022-09-28 DIAGNOSIS — K219 Gastro-esophageal reflux disease without esophagitis: Secondary | ICD-10-CM | POA: Diagnosis not present

## 2022-09-28 MED ORDER — FLUTICASONE PROPIONATE 50 MCG/ACT NA SUSP: 2.0000 | Freq: Every day | NASAL | 5 refills | Status: AC

## 2022-09-28 MED ORDER — AZELASTINE-FLUTICASONE 137-50 MCG/ACT NA SUSP: 1.0000 | Freq: Two times a day (BID) | NASAL | 5 refills | Status: DC

## 2022-09-28 NOTE — Assessment & Plan Note (Signed)
Past history - Coughing, wheezing for 5+ yrs. Triggers include weather change. Using albuterol on rare occasions. Covid-19 in 2023 and 2022. Takes PPI for GERD. 2023 spirometry showed moderate obstruction with 31% improvement in FEV1 post bronchodilator treatment. Clinically feeling unchanged.  Interim history - Pulmicort flared reflux and only took Flovent sparingly. Rare wheezing episodes. . Get CXR - no recent one and patient concerned about other issues.  . Daily controller medication(s): start Flovent 170mcg 2 puffs twice a day and rinse mouth after each use.  . May use albuterol rescue inhaler 2 puffs every 4 to 6 hours as needed for shortness of breath, chest tightness, coughing, and wheezing. Monitor frequency of use.   Get spirometry at next visit.

## 2022-09-28 NOTE — Assessment & Plan Note (Addendum)
Past history - Shellfish caused hives and trouble breathing in the past. Avoiding chocolate, broccoli and red meat due to worsening acid reflux. 2023 skin prick testing was negative to select foods but positive control was borderline.  Interim history -2023 bloodwork was positive to shellfish and mollusks. . Continue to avoid foods that are bothersome - shellfish and mollusks. . For mild symptoms you can take over the counter antihistamines such as Benadryl and monitor symptoms closely. If symptoms worsen or if you have severe symptoms including breathing issues, throat closure, significant swelling, whole body hives, severe diarrhea and vomiting, lightheadedness then inject epinephrine and seek immediate medical care afterwards. . Action plan in place.

## 2022-09-28 NOTE — Assessment & Plan Note (Deleted)
Past history - Perennial rhinoconjunctivitis symptoms for 10 years.  Uses Zyrtec and Flonase with some benefit.  No prior ENT evaluation. 2023 skin prick testing showed: Positive to weed pollen. Interim history - 2023 bloodwork positive to dust mites, grass, cockroach, trees, ragweed. Still symptomatic with antihistamine and Ryaltris.  Continue environmental control measures as below.  Use over the counter antihistamines such as Zyrtec (cetirizine), Claritin (loratadine), Allegra (fexofenadine), or Xyzal (levocetirizine) daily as needed. May take twice a day during allergy flares. May switch antihistamines every few months.  Start dymista (fluticasone + azelastine nasal spray combination) 1 spray per nostril twice a day.  Let me know if this is not covered.  This replaces Ryaltris.   Nasal saline spray (i.e., Simply Saline) or nasal saline lavage (i.e., NeilMed) is recommended as needed and prior to medicated nasal sprays.  Consider allergy injections for long term control if above medications do not help the symptoms.  Discussed starting singulair but patient declined.

## 2022-09-28 NOTE — Assessment & Plan Note (Signed)
Past history - 2023 bloodwork positive ANA with elevated RNP antibody.  Interim history - has appointment with rheum in March. Apparently had joint pains in the past and rheum evaluation was unremarkable at that time besides an elevated bloodwork. Notes not available for review.  . Continue proper skincare. . Follow up with rheumatology.  Faythe Ghee to continue with zyrtec (cetirizine) 10mg  daily.

## 2022-09-28 NOTE — Patient Instructions (Addendum)
Asthma Get CXR Daily controller medication(s): start Flovent 141mcg 2 puffs twice a day and rinse mouth after each use.  May use albuterol rescue inhaler 2 puffs every 4 to 6 hours as needed for shortness of breath, chest tightness, coughing, and wheezing. Monitor frequency of use.  Asthma control goals:  Full participation in all desired activities (may need albuterol before activity) Albuterol use two times or less a week on average (not counting use with activity) Cough interfering with sleep two times or less a month Oral steroids no more than once a year No hospitalizations   Environmental allergies 2023 allergy testing: positive to dust mites, grass, cockroach, trees, ragweed Continue environmental control measures as below. Use over the counter antihistamines such as Zyrtec (cetirizine), Claritin (loratadine), Allegra (fexofenadine), or Xyzal (levocetirizine) daily as needed. May take twice a day during allergy flares. May switch antihistamines every few months. Start dymista (fluticasone + azelastine nasal spray combination) 1 spray per nostril twice a day. Let me know if this is not covered. This replaces Ryaltris.   Nasal saline spray (i.e., Simply Saline) or nasal saline lavage (i.e., NeilMed) is recommended as needed and prior to medicated nasal sprays. Consider allergy injections for long term control if above medications do not help the symptoms - handout given.   Itching Continue proper skincare. Follow up with rheumatology.  Okay to continue with zyrtec (cetirizine) 10mg  daily.  Food Continue to avoid foods that are bothersome - shellfish, mollusks. For mild symptoms you can take over the counter antihistamines such as Benadryl and monitor symptoms closely. If symptoms worsen or if you have severe symptoms including breathing issues, throat closure, significant swelling, whole body hives, severe diarrhea and vomiting, lightheadedness then inject epinephrine and seek  immediate medical care afterwards. Action plan in place.   Heartburn: Continue lifestyle and dietary modifications. Take Protonix 40mg  daily in the morning. Nothing to eat or drink for 30 minutes afterwards.   Follow up in 2 months or sooner if needed.    Control of House Dust Mite Allergen Dust mite allergens are a common trigger of allergy and asthma symptoms. While they can be found throughout the house, these microscopic creatures thrive in warm, humid environments such as bedding, upholstered furniture and carpeting. Because so much time is spent in the bedroom, it is essential to reduce mite levels there.  Encase pillows, mattresses, and box springs in special allergen-proof fabric covers or airtight, zippered plastic covers.  Bedding should be washed weekly in hot water (130 F) and dried in a hot dryer. Allergen-proof covers are available for comforters and pillows that can't be regularly washed.  Wash the allergy-proof covers every few months. Minimize clutter in the bedroom. Keep pets out of the bedroom.  Keep humidity less than 50% by using a dehumidifier or air conditioning. You can buy a humidity measuring device called a hygrometer to monitor this.  If possible, replace carpets with hardwood, linoleum, or washable area rugs. If that's not possible, vacuum frequently with a vacuum that has a HEPA filter. Remove all upholstered furniture and non-washable window drapes from the bedroom. Remove all non-washable stuffed toys from the bedroom.  Wash stuffed toys weekly. Reducing Pollen Exposure Pollen seasons: trees (spring), grass (summer) and ragweed/weeds (fall). Keep windows closed in your home and car to lower pollen exposure.  Install air conditioning in the bedroom and throughout the house if possible.  Avoid going out in dry windy days - especially early morning. Pollen counts are highest between 5 -  10 AM and on dry, hot and windy days.  Save outside activities for late  afternoon or after a heavy rain, when pollen levels are lower.  Avoid mowing of grass if you have grass pollen allergy. Be aware that pollen can also be transported indoors on people and pets.  Dry your clothes in an automatic dryer rather than hanging them outside where they might collect pollen.  Rinse hair and eyes before bedtime. Cockroach Allergen Avoidance Cockroaches are often found in the homes of densely populated urban areas, schools or commercial buildings, but these creatures can lurk almost anywhere. This does not mean that you have a dirty house or living area. Block all areas where roaches can enter the home. This includes crevices, wall cracks and windows.  Cockroaches need water to survive, so fix and seal all leaky faucets and pipes. Have an exterminator go through the house when your family and pets are gone to eliminate any remaining roaches. Keep food in lidded containers and put pet food dishes away after your pets are done eating. Vacuum and sweep the floor after meals, and take out garbage and recyclables. Use lidded garbage containers in the kitchen. Wash dishes immediately after use and clean under stoves, refrigerators or toasters where crumbs can accumulate. Wipe off the stove and other kitchen surfaces and cupboards regularly.

## 2022-09-28 NOTE — Assessment & Plan Note (Signed)
Past history - Perennial rhinoconjunctivitis symptoms for 10 years.  Uses Zyrtec and Flonase with some benefit.  No prior ENT evaluation. 2023 skin prick testing showed: Positive to weed pollen. Interim history - 2023 bloodwork positive to dust mites, grass, cockroach, trees, ragweed. Still symptomatic with antihistamine and Ryaltris.  Continue environmental control measures as below.  Use over the counter antihistamines such as Zyrtec (cetirizine), Claritin (loratadine), Allegra (fexofenadine), or Xyzal (levocetirizine) daily as needed. May take twice a day during allergy flares. May switch antihistamines every few months.  Start dymista (fluticasone + azelastine nasal spray combination) 1 spray per nostril twice a day.  Let me know if this is not covered.  This replaces Ryaltris.   Nasal saline spray (i.e., Simply Saline) or nasal saline lavage (i.e., NeilMed) is recommended as needed and prior to medicated nasal sprays.  Consider allergy injections for long term control if above medications do not help the symptoms.  Discussed starting singulair but patient declined.  

## 2022-09-28 NOTE — Assessment & Plan Note (Signed)
Only taking meds prn.  Continue lifestyle and dietary modifications. Take Protonix 40mg daily in the morning. Nothing to eat or drink for 30 minutes afterwards.  

## 2022-10-02 ENCOUNTER — Encounter: Payer: Self-pay | Admitting: Internal Medicine

## 2022-10-12 ENCOUNTER — Ambulatory Visit
Admission: RE | Admit: 2022-10-12 | Discharge: 2022-10-12 | Disposition: A | Discharge status: Home / Self Care | Payer: No Typology Code available for payment source | Source: Ambulatory Visit | Attending: Allergy | Admitting: Allergy

## 2022-10-13 ENCOUNTER — Encounter: Payer: Self-pay | Admitting: Internal Medicine

## 2022-10-26 ENCOUNTER — Other Ambulatory Visit: Payer: Self-pay | Admitting: Obstetrics and Gynecology

## 2022-10-26 DIAGNOSIS — Z1231 Encounter for screening mammogram for malignant neoplasm of breast: Secondary | ICD-10-CM

## 2022-11-16 ENCOUNTER — Ambulatory Visit (AMBULATORY_SURGERY_CENTER): Payer: Self-pay | Admitting: *Deleted

## 2022-11-16 ENCOUNTER — Telehealth: Payer: Self-pay | Admitting: *Deleted

## 2022-11-16 VITALS — Ht 66.0 in | Wt 245.0 lb

## 2022-11-16 DIAGNOSIS — Z1211 Encounter for screening for malignant neoplasm of colon: Secondary | ICD-10-CM

## 2022-11-16 MED ORDER — NA SULFATE-K SULFATE-MG SULF 17.5-3.13-1.6 GM/177ML PO SOLN: 1.0000 | Freq: Once | ORAL | 0 refills | Status: AC

## 2022-11-16 NOTE — Progress Notes (Signed)

## 2022-11-16 NOTE — Telephone Encounter (Signed)
Pt had pre-visit this morning screening colonoscopy scheduled for 12/14/22,requesting if she can have EGD at the same time has a H/O [place] and stated " I am still having unexplained upper abdominal pain on both sides and acid still coming back up". She is on protonix and carafate,please advise?

## 2022-11-17 NOTE — Telephone Encounter (Signed)
OK to change to EGD/colonoscopy  Diagnoses  RUQ pain, GERD

## 2022-11-19 NOTE — Telephone Encounter (Signed)
Scheduler called and spoke with patient - patient agreed to change colon to endocolon - date and time changed for procedure-new instructions sent to MyChart for patient-instructions also sent via mail to patient

## 2022-12-06 NOTE — Progress Notes (Signed)
Follow Up Note  RE: [name] MRN: [id number] DOB: 07/27/1961 Date of Office [name]: 12/07/2022  Referring provider: [name], MD Primary care provider: [name], MD  Chief Complaint: Follow-up (Follow up pt states that sinus spray is not helping.) and asthma (Used her rescue inhaler yesterday she states that helps when having trouble breathing.)  History of Present Illness: I had the pleasure of seeing [name] at the [facility] on 12/07/2022. She is a 62 y.o. female, who is being followed for allergic rhinoconjunctivitis, asthma, food allergy, GERD and pruritus. Her previous allergy office [name] was on 09/28/2022 with Dr. Selena [name]. Today is a regular follow up [name].  Seasonal and perennial allergic rhinoconjunctivitis Still having some nasal congestion and pain. She has braces on but it's about to come off next month. Symptoms persistent for 5 months.   Currently on Flonase 2 sprays per nostril BID, azelastine 2 sprays per nostril BID. No nosebleeds. Taking zyrtec 10mg  daily.  Hesitant about Singulair.    Moderate persistent asthma Used albuterol yesterday for the first time in awhile.  Using Flovent 2 puffs twice a day and not sure if helping.   Food allergy Avoiding shellfish and mollusks. Now concerned if she has allergy to eggs - lately has been eating it more often and yesterday had to use albuterol. Denies any immediate symptoms.    Gastroesophageal reflux disease Taking Protonix 40mg  and Carafate sometimes.  Pruritus About the same.  Has rheum appt in February.    10/12/2022 CXR: "IMPRESSION: No active cardiopulmonary disease."  Assessment and Plan: [name] is a 62 y.o. female with: Moderate persistent asthma Past history - Coughing, wheezing for 5+ yrs. Triggers include weather change. Using albuterol on rare occasions. Covid-19 in 2023 and 2022. Takes PPI for GERD. 2023 spirometry showed  moderate obstruction with 31% improvement in FEV1 post bronchodilator treatment. Clinically feeling unchanged.  Interim history - unsure if daily Flovent is helping. 2023 CXR was normal.  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 Patient seems to be an underperceiver of her symptoms.  Will do 1 month trail of ICS/LABA inhaler.  Daily controller medication(s): start Symbicort 2 puffs twice a day with spacer and rinse mouth afterwards. STOP Flovent for now.  May use albuterol rescue inhaler 2 puffs every 4 to 6 hours as needed for shortness of breath, chest tightness, coughing, and wheezing. Monitor frequency of use.  Get spirometry at next [name].  Seasonal and perennial allergic rhinoconjunctivitis Past history - Perennial rhinoconjunctivitis symptoms for 10 years.  Uses Zyrtec and Flonase with some benefit.  No prior ENT evaluation. 2023 skin prick testing showed: Positive to weed pollen. 2023 bloodwork positive to dust mites, grass, cockroach, trees, ragweed. Interim history - sinus congestion unchanged.  Continue environmental control measures as below. Use over the counter antihistamines such as Claritin (loratadine), Allegra (fexofenadine), or Xyzal (levocetirizine) daily as needed. May take twice a day during allergy flares. May switch antihistamines every few months. Use azelastine nasal spray 1-2 sprays per nostril twice a day as needed for runny nose/drainage. Use Flonase (fluticasone) nasal spray 1 spray per nostril twice a day as needed for nasal congestion.  Nasal saline spray (i.e., Simply Saline) or nasal saline lavage (i.e., NeilMed) is recommended as needed and prior to medicated nasal sprays. Consider allergy injections for long term control if above medications do not help the symptoms.  Consider starting Singulair.  Refer to ENT -  turbinate hypertrophy.   Anaphylactic reaction due to food, subsequent encounter Past history - Shellfish caused  hives and trouble breathing in the past. Avoiding chocolate, broccoli and red meat due to worsening acid reflux. 2023 skin prick testing was negative to select foods but positive control was borderline. 2023 bloodwork was positive to shellfish and mollusks. Interim history - no reactions.  Continue to avoid foods that are bothersome - shellfish, mollusks. Monitor symptoms after eating eggs.  For mild symptoms you can take over the counter antihistamines such as Benadryl and monitor symptoms closely. If symptoms worsen or if you have severe symptoms including breathing issues, throat closure, significant swelling, whole body hives, severe diarrhea and vomiting, lightheadedness then inject epinephrine and seek immediate medical care afterwards. Action plan in place.   Gastroesophageal reflux disease Only taking meds prn.  Continue lifestyle and dietary modifications. Take Protonix 40mg  daily in the morning. Nothing to eat or drink for 30 minutes afterwards.   Pruritus Past history - 2023 bloodwork positive ANA with elevated RNP antibody.  Interim history - has appointment with rheum later thi year. Symptom unchanged.  Continue proper skincare. Follow up with rheumatology.   Return in about 2 months (around 02/05/2023).  Meds ordered this encounter  Medications   budesonide-formoterol (SYMBICORT) 80-4.5 MCG/ACT inhaler    Sig: Inhale 2 puffs into the lungs in the morning and at bedtime. with spacer and rinse mouth afterwards.    Dispense:  1 each    Refill:  1   Lab Orders  No laboratory test(s) ordered today    Diagnostics: Spirometry:  Tracings reviewed. Her effort: Good reproducible efforts. FVC: 1.70L FEV1: 1.13L, 49% predicted FEV1/FVC ratio: 66% Interpretation: Spirometry consistent with mixed obstructive and restrictive disease.  Please see scanned spirometry results for details.  Medication List:  Current Outpatient Medications  Medication Sig Dispense Refill   albuterol  (PROVENTIL HFA;VENTOLIN HFA) 108 (90 Base) MCG/ACT inhaler Inhale 2 puffs into the lungs every 6 (six) hours as needed for wheezing or shortness of breath.     ALOE VERA JUICE PO Take by mouth as needed. 2 oz prn     azelastine (ASTELIN) 0.1 % nasal spray Place 2 sprays into both nostrils 2 (two) times daily. 30 mL 5   budesonide-formoterol (SYMBICORT) 80-4.5 MCG/ACT inhaler Inhale 2 puffs into the lungs in the morning and at bedtime. with spacer and rinse mouth afterwards. 1 each 1   cetirizine (ZYRTEC) 10 MG tablet Take 10 mg by mouth daily as needed.     Cholecalciferol (VITAMIN D3) 125 MCG (5000 UT) CAPS Take 1 capsule by mouth daily.     Digestive Enzymes (DIGESTIVE ENZYME PO) Take by mouth as needed.     EPINEPHrine 0.3 mg/0.3 mL IJ SOAJ injection See admin instructions.     fluticasone (FLONASE) 50 MCG/ACT nasal spray Place 2 sprays into both nostrils daily. 16 g 5   MAGNESIUM PO Take 120 mg by mouth daily.     meloxicam (MOBIC) 15 MG tablet Take 1 tablet (15 mg total) by mouth daily. 30 tablet 1   pantoprazole (PROTONIX) 40 MG tablet Take 40 mg by mouth daily as needed.     Spacer/Aero-Holding Chambers (AEROCHAMBER MV) inhaler Use as instructed 1 each 2   sucralfate (CARAFATE) 1 g tablet Take 1 g by mouth 4 (four) times daily as needed.     No current facility-administered medications for this [name].   Allergies: Allergies  Allergen Reactions   Amoxicillin Hives, Swelling and Other (  See Comments)   Shellfish Allergy Hives    Any seafood.   Shellfish-Derived Products     Other reaction(s): hives/swelling   I reviewed her past medical history, social history, family history, and environmental history and no significant changes have been reported from her previous [name].  Review of Systems  Constitutional:  Negative for appetite change, chills, fever and unexpected weight change.  HENT:  Positive for congestion, sinus pressure and sinus pain. Negative for postnasal drip and  rhinorrhea.   Eyes:  Positive for itching.  Respiratory:  Negative for cough, chest tightness, shortness of breath and wheezing.   Cardiovascular:  Negative for chest pain.  Gastrointestinal:  Negative for abdominal pain.  Genitourinary:  Negative for difficulty urinating.  Skin:  Negative for rash.  Allergic/Immunologic: Positive for environmental allergies and food allergies.  Neurological:  Negative for headaches.    Objective: BP 122/72   Pulse 80   Temp 98.4 F (36.9 C)   Resp 20   Ht 5\' 6"  (1.676 m)   Wt 248 lb (112.5 kg)   SpO2 97%   BMI 40.03 kg/m  Body mass index is 40.03 kg/m. Physical Exam Vitals and nursing note reviewed.  Constitutional:      Appearance: Normal appearance. She is well-developed. She is obese.  HENT:     Head: Normocephalic and atraumatic.     Right Ear: Tympanic membrane and external ear normal.     Left Ear: Tympanic membrane and external ear normal.     Nose: Congestion (turbinate hypertrophy b/l) present.     Mouth/Throat:     Mouth: Mucous membranes are moist.     Pharynx: Oropharynx is clear.  Eyes:     Conjunctiva/sclera: Conjunctivae normal.  Cardiovascular:     Rate and Rhythm: Normal rate and regular rhythm.     Heart sounds: Normal heart sounds. No murmur heard.    No friction rub. No gallop.  Pulmonary:     Effort: Pulmonary effort is normal.     Breath sounds: Normal breath sounds. No wheezing, rhonchi or rales.  Musculoskeletal:     Cervical back: Neck supple.  Skin:    General: Skin is warm.     Findings: No rash.  Neurological:     Mental Status: She is alert and oriented to person, place, and time.  Psychiatric:        Behavior: Behavior normal.   Previous notes and tests were reviewed. The plan was reviewed with the patient/family, and all questions/concerned were addressed.  It was my pleasure to see Majestic today and participate in her care. Please feel free to contact me with any questions or  concerns.  Sincerely,  Wyline Mood, DO Allergy & Immunology  [facility] [place] office: [phone] [facility] office: [phone]

## 2022-12-07 ENCOUNTER — Other Ambulatory Visit: Payer: Self-pay

## 2022-12-07 ENCOUNTER — Ambulatory Visit (INDEPENDENT_AMBULATORY_CARE_PROVIDER_SITE_OTHER): Payer: No Typology Code available for payment source | Admitting: Allergy

## 2022-12-07 ENCOUNTER — Encounter: Payer: Self-pay | Admitting: Allergy

## 2022-12-07 VITALS — BP 122/72 | HR 80 | Temp 98.4°F | Resp 20 | Ht 66.0 in | Wt 248.0 lb

## 2022-12-07 DIAGNOSIS — T7800XD Anaphylactic reaction due to unspecified food, subsequent encounter: Secondary | ICD-10-CM

## 2022-12-07 DIAGNOSIS — J302 Other seasonal allergic rhinitis: Secondary | ICD-10-CM

## 2022-12-07 DIAGNOSIS — H1013 Acute atopic conjunctivitis, bilateral: Secondary | ICD-10-CM

## 2022-12-07 DIAGNOSIS — K219 Gastro-esophageal reflux disease without esophagitis: Secondary | ICD-10-CM

## 2022-12-07 DIAGNOSIS — J4540 Moderate persistent asthma, uncomplicated: Secondary | ICD-10-CM

## 2022-12-07 DIAGNOSIS — H1010 Acute atopic conjunctivitis, unspecified eye: Secondary | ICD-10-CM

## 2022-12-07 DIAGNOSIS — L299 Pruritus, unspecified: Secondary | ICD-10-CM

## 2022-12-07 MED ORDER — BUDESONIDE-FORMOTEROL FUMARATE 80-4.5 MCG/ACT IN AERO: 2.0000 | INHALATION_SPRAY | Freq: Two times a day (BID) | RESPIRATORY_TRACT | 1 refills | Status: DC

## 2022-12-07 NOTE — Assessment & Plan Note (Signed)
Past history - Coughing, wheezing for 5+ yrs. Triggers include weather change. Using albuterol on rare occasions. Covid-19 in 2023 and 2022. Takes PPI for GERD. 2023 spirometry showed moderate obstruction with 31% improvement in FEV1 post bronchodilator treatment. Clinically feeling unchanged.  Interim history - unsure if daily Flovent is helping. 2023 CXR was normal.  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 Patient seems to be an underperceiver of her symptoms.  Will do 1 month trail of ICS/LABA inhaler.  Daily controller medication(s): start Symbicort 87mcg 2 puffs twice a day with spacer and rinse mouth afterwards. STOP Flovent for now.  May use albuterol rescue inhaler 2 puffs every 4 to 6 hours as needed for shortness of breath, chest tightness, coughing, and wheezing. Monitor frequency of use.  Get spirometry at next visit.

## 2022-12-07 NOTE — Assessment & Plan Note (Signed)
Past history - Perennial rhinoconjunctivitis symptoms for 10 years.  Uses Zyrtec and Flonase with some benefit.  No prior ENT evaluation. 2023 skin prick testing showed: Positive to weed pollen. 2023 bloodwork positive to dust mites, grass, cockroach, trees, ragweed. Interim history - sinus congestion unchanged.  Continue environmental control measures as below. Use over the counter antihistamines such as Claritin (loratadine), Allegra (fexofenadine), or Xyzal (levocetirizine) daily as needed. May take twice a day during allergy flares. May switch antihistamines every few months. Use azelastine nasal spray 1-2 sprays per nostril twice a day as needed for runny nose/drainage. Use Flonase (fluticasone) nasal spray 1 spray per nostril twice a day as needed for nasal congestion.  Nasal saline spray (i.e., Simply Saline) or nasal saline lavage (i.e., NeilMed) is recommended as needed and prior to medicated nasal sprays. Consider allergy injections for long term control if above medications do not help the symptoms.  Consider starting Singulair.  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

## 2022-12-07 NOTE — Assessment & Plan Note (Signed)
Only taking meds prn.  Continue lifestyle and dietary modifications. Take Protonix 40mg  daily in the morning. Nothing to eat or drink for 30 minutes afterwards.

## 2022-12-07 NOTE — Patient Instructions (Addendum)
Asthma Daily controller medication(s): start Symbicort 2 puffs twice a day with spacer and rinse mouth afterwards. STOP Flovent for now.  May use albuterol rescue inhaler 2 puffs every 4 to 6 hours as needed for shortness of breath, chest tightness, coughing, and wheezing. Monitor frequency of use.  Asthma control goals:  Full participation in all desired activities (may need albuterol before activity) Albuterol use two times or less a week on average (not counting use with activity) Cough interfering with sleep two times or less a month Oral steroids no more than once a year No hospitalizations   Environmental allergies 2023 allergy testing: positive to dust mites, grass, cockroach, trees, ragweed. Continue environmental control measures as below. Use over the counter antihistamines such as Claritin (loratadine), Allegra (fexofenadine), or Xyzal (levocetirizine) daily as needed. May take twice a day during allergy flares. May switch antihistamines every few months. Use azelastine nasal spray 1-2 sprays per nostril twice a day as needed for runny nose/drainage. Use Flonase (fluticasone) nasal spray 1 spray per nostril twice a day as needed for nasal congestion.  Nasal saline spray (i.e., Simply Saline) or nasal saline lavage (i.e., NeilMed) is recommended as needed and prior to medicated nasal sprays. Consider allergy injections for long term control if above medications do not help the symptoms.  Consider starting Singulair.   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   Itching Continue proper skincare. Follow up with rheumatology.   Food Continue to avoid foods that are bothersome - shellfish, mollusks. Monitor symptoms after eating eggs.  For mild symptoms you can take over the counter antihistamines such as Benadryl and monitor symptoms closely. If symptoms worsen or if you have severe symptoms including breathing issues, throat closure, significant swelling, whole body hives,  severe diarrhea and vomiting, lightheadedness then inject epinephrine and seek immediate medical care afterwards. Action plan in place.   Heartburn: Continue lifestyle and dietary modifications. Take Protonix 40mg  daily in the morning. Nothing to eat or drink for 30 minutes afterwards.   Follow up in 2 months or sooner if needed.    Control of House Dust Mite Allergen Dust mite allergens are a common trigger of allergy and asthma symptoms. While they can be found throughout the house, these microscopic creatures thrive in warm, humid environments such as bedding, upholstered furniture and carpeting. Because so much time is spent in the bedroom, it is essential to reduce mite levels there.  Encase pillows, mattresses, and box springs in special allergen-proof fabric covers or airtight, zippered plastic covers.  Bedding should be washed weekly in hot water (130 F) and dried in a hot dryer. Allergen-proof covers are available for comforters and pillows that can't be regularly washed.  Wash the allergy-proof covers every few months. Minimize clutter in the bedroom. Keep pets out of the bedroom.  Keep humidity less than 50% by using a dehumidifier or air conditioning. You can buy a humidity measuring device called a hygrometer to monitor this.  If possible, replace carpets with hardwood, linoleum, or washable area rugs. If that's not possible, vacuum frequently with a vacuum that has a HEPA filter. Remove all upholstered furniture and non-washable window drapes from the bedroom. Remove all non-washable stuffed toys from the bedroom.  Wash stuffed toys weekly. Reducing Pollen Exposure Pollen seasons: trees (spring), grass (summer) and ragweed/weeds (fall). Keep windows closed in your home and car to lower pollen exposure.  Install air conditioning in the bedroom and throughout the house if possible.  Avoid going out in  dry windy days - especially early morning. Pollen counts are highest between 5  - 10 AM and on dry, hot and windy days.  Save outside activities for late afternoon or after a heavy rain, when pollen levels are lower.  Avoid mowing of grass if you have grass pollen allergy. Be aware that pollen can also be transported indoors on people and pets.  Dry your clothes in an automatic dryer rather than hanging them outside where they might collect pollen.  Rinse hair and eyes before bedtime. Cockroach Allergen Avoidance Cockroaches are often found in the homes of densely populated urban areas, schools or commercial buildings, but these creatures can lurk almost anywhere. This does not mean that you have a dirty house or living area. Block all areas where roaches can enter the home. This includes crevices, wall cracks and windows.  Cockroaches need water to survive, so fix and seal all leaky faucets and pipes. Have an exterminator go through the house when your family and pets are gone to eliminate any remaining roaches. Keep food in lidded containers and put pet food dishes away after your pets are done eating. Vacuum and sweep the floor after meals, and take out garbage and recyclables. Use lidded garbage containers in the kitchen. Wash dishes immediately after use and clean under stoves, refrigerators or toasters where crumbs can accumulate. Wipe off the stove and other kitchen surfaces and cupboards regularly.

## 2022-12-07 NOTE — Assessment & Plan Note (Signed)
Past history - Shellfish caused hives and trouble breathing in the past. Avoiding chocolate, broccoli and red meat due to worsening acid reflux. 2023 skin prick testing was negative to select foods but positive control was borderline. 2023 bloodwork was positive to shellfish and mollusks. Interim history - no reactions.  Continue to avoid foods that are bothersome - shellfish, mollusks. Monitor symptoms after eating eggs.  For mild symptoms you can take over the counter antihistamines such as Benadryl and monitor symptoms closely. If symptoms worsen or if you have severe symptoms including breathing issues, throat closure, significant swelling, whole body hives, severe diarrhea and vomiting, lightheadedness then inject epinephrine and seek immediate medical care afterwards. Action plan in place.

## 2022-12-07 NOTE — Assessment & Plan Note (Signed)
Past history - 2023 bloodwork positive ANA with elevated RNP antibody.  Interim history - has appointment with rheum later thi year. Symptom unchanged.  Continue proper skincare. Follow up with rheumatology.

## 2022-12-14 ENCOUNTER — Encounter: Payer: No Typology Code available for payment source | Admitting: Internal Medicine

## 2022-12-19 ENCOUNTER — Encounter: Payer: Self-pay | Admitting: Internal Medicine

## 2022-12-21 ENCOUNTER — Ambulatory Visit
Admission: RE | Admit: 2022-12-21 | Discharge: 2022-12-21 | Disposition: A | Discharge status: Home / Self Care | Payer: No Typology Code available for payment source | Source: Ambulatory Visit | Attending: Obstetrics and Gynecology | Admitting: Obstetrics and Gynecology

## 2022-12-21 DIAGNOSIS — Z1231 Encounter for screening mammogram for malignant neoplasm of breast: Secondary | ICD-10-CM

## 2022-12-22 ENCOUNTER — Encounter: Payer: Self-pay | Admitting: Internal Medicine

## 2022-12-22 ENCOUNTER — Ambulatory Visit (AMBULATORY_SURGERY_CENTER): Payer: No Typology Code available for payment source | Admitting: Internal Medicine

## 2022-12-22 VITALS — BP 152/68 | HR 58 | Temp 96.9°F | Resp 21 | Ht 66.0 in | Wt 245.0 lb

## 2022-12-22 DIAGNOSIS — Z09 Encounter for follow-up examination after completed treatment for conditions other than malignant neoplasm: Secondary | ICD-10-CM | POA: Diagnosis not present

## 2022-12-22 DIAGNOSIS — R1011 Right upper quadrant pain: Secondary | ICD-10-CM

## 2022-12-22 DIAGNOSIS — Z86010 Personal history of colonic polyps: Secondary | ICD-10-CM | POA: Diagnosis present

## 2022-12-22 MED ORDER — SODIUM CHLORIDE 0.9 % IV SOLN: 500.0000 mL | Freq: Once | INTRAVENOUS | Status: DC

## 2022-12-22 NOTE — Progress Notes (Signed)
[facility] History and Physical   Primary Care Physician:  Lujean Amel, MD   Reason for Procedure:   Upper abd pain R>L, hx colon polyp  Plan:    colonoscopy     HPI: [name] is a 62 y.o. female here to evaluate upper abd pain in setting of GERD hx and to do surveillance colonoscopy   Past Medical History:  Diagnosis Date   Allergy    SEASONAL   Chronic RUQ pain    Colon polyps    hyperplastic - some right sided   Fe deficiency anemia    Foot pain, left    Frequent headaches    GERD (gastroesophageal reflux disease)    Helicobacter pylori antibody positive    Hemorrhoids    Hyperlipidemia    NO MEDS   Hypertension    NO MEDS   IBS (irritable bowel syndrome)    Joint pain    Menopausal symptoms    Mild asthma    Obesity    Palpitations    Pneumonia    Shortness of breath    Small intestinal bacterial overgrowth 08/08/2019   Swelling of both lower extremities     Past Surgical History:  Procedure Laterality Date   ABDOMINAL HYSTERECTOMY  2005   APPENDECTOMY  1984   [facility]   COLONOSCOPY     2011 and 2018   DILATION AND CURETTAGE OF UTERUS  1991   ESOPHAGOGASTRODUODENOSCOPY  10/2017    Prior to Admission medications   Medication Sig Start Date End Date Taking? Authorizing Provider  cetirizine (ZYRTEC) 10 MG tablet Take 10 mg by mouth daily as needed.   Yes [provider]  albuterol (PROVENTIL HFA;VENTOLIN HFA) 108 (90 Base) MCG/ACT inhaler Inhale 2 puffs into the lungs every 6 (six) hours as needed for wheezing or shortness of breath.    [provider]  ALOE VERA JUICE PO Take by mouth as needed. 2 oz prn Patient not taking: Reported on 12/22/2022    [provider]  azelastine (ASTELIN) 0.1 % nasal spray Place 2 sprays into both nostrils 2 (two) times daily. 09/28/22   Garnet Sierras, DO  budesonide-formoterol (SYMBICORT) 80-4.5 MCG/ACT inhaler Inhale 2 puffs into the lungs in the  morning and at bedtime. with spacer and rinse mouth afterwards. Patient not taking: Reported on 12/22/2022 12/07/22   Garnet Sierras, DO  Cholecalciferol (VITAMIN D3) 125 MCG (5000 UT) CAPS Take 1 capsule by mouth daily.    [provider]  Digestive Enzymes (DIGESTIVE ENZYME PO) Take by mouth as needed.    [provider]  EPINEPHrine 0.3 mg/0.3 mL IJ SOAJ injection See admin instructions. Patient not taking: Reported on 12/22/2022 10/02/19   [provider]  fluticasone (FLONASE) 50 MCG/ACT nasal spray Place 2 sprays into both nostrils daily. 09/28/22   Garnet Sierras, DO  MAGNESIUM PO Take 120 mg by mouth daily.    [provider]  pantoprazole (PROTONIX) 40 MG tablet Take 40 mg by mouth daily as needed. 10/16/21   [provider]  Spacer/Aero-Holding Chambers (AEROCHAMBER MV) inhaler Use as instructed 07/15/22   Garnet Sierras, DO  sucralfate (CARAFATE) 1 g tablet Take 1 g by mouth 4 (four) times daily as needed. 10/16/21   [provider]    Current Outpatient Medications  Medication Sig Dispense Refill   cetirizine (ZYRTEC) 10 MG tablet Take 10 mg by mouth daily as needed.  albuterol (PROVENTIL HFA;VENTOLIN HFA) 108 (90 Base) MCG/ACT inhaler Inhale 2 puffs into the lungs every 6 (six) hours as needed for wheezing or shortness of breath.     ALOE VERA JUICE PO Take by mouth as needed. 2 oz prn (Patient not taking: Reported on 12/22/2022)     azelastine (ASTELIN) 0.1 % nasal spray Place 2 sprays into both nostrils 2 (two) times daily. 30 mL 5   budesonide-formoterol (SYMBICORT) 80-4.5 MCG/ACT inhaler Inhale 2 puffs into the lungs in the morning and at bedtime. with spacer and rinse mouth afterwards. (Patient not taking: Reported on 12/22/2022) 1 each 1   Cholecalciferol (VITAMIN D3) 125 MCG (5000 UT) CAPS Take 1 capsule by mouth daily.     Digestive Enzymes (DIGESTIVE ENZYME PO) Take by mouth as needed.     EPINEPHrine 0.3 mg/0.3 mL IJ SOAJ injection  See admin instructions. (Patient not taking: Reported on 12/22/2022)     fluticasone (FLONASE) 50 MCG/ACT nasal spray Place 2 sprays into both nostrils daily. 16 g 5   MAGNESIUM PO Take 120 mg by mouth daily.     pantoprazole (PROTONIX) 40 MG tablet Take 40 mg by mouth daily as needed.     Spacer/Aero-Holding Chambers (AEROCHAMBER MV) inhaler Use as instructed 1 each 2   sucralfate (CARAFATE) 1 g tablet Take 1 g by mouth 4 (four) times daily as needed.     Current Facility-Administered Medications  Medication Dose Route Frequency Provider Last Rate Last Admin   0.9 %  sodium chloride infusion  500 mL Intravenous Once Gatha Mayer, MD        Allergies as of 12/22/2022 - Review Complete 12/22/2022  Allergen Reaction Noted   Amoxicillin Hives, Swelling, and Other (See Comments) 12/06/2011   Shellfish allergy Hives 12/06/2011   Shellfish-derived products  09/28/2022    Family History  Problem Relation Age of Onset   Congestive Heart Failure Mother    Diabetes Mother    High blood pressure Mother    Obesity Mother    High blood pressure Father    Diabetes Father    Breast cancer Maternal Aunt    Breast cancer Cousin    Stomach cancer Neg Hx    Colon cancer Neg Hx    Throat cancer Neg Hx    Pancreatic cancer Neg Hx    Colon polyps Neg Hx    Crohn's disease Neg Hx    Esophageal cancer Neg Hx    Rectal cancer Neg Hx    Ulcerative colitis Neg Hx     Social History   Socioeconomic History   Marital status: Married    Spouse name: Legrand Como   Number of children: 2   Years of education: Not on file   Highest education level: Not on file  Occupational History   Occupation: HAIR STYLIST  Tobacco Use   Smoking status: Never    Passive exposure: Never   Smokeless tobacco: Never  Vaping Use   Vaping Use: Never used  Substance and Sexual Activity   Alcohol use: No   Drug use: No   Sexual activity: Not on file  Other Topics Concern   Not on file  Social History Narrative    Married, hair stylist   2 children   No EtOH, tobacco, drugs   Social Determinants of Health   Financial Resource Strain: Not on file  Food Insecurity: Not on file  Transportation Needs: Not on file  Physical Activity: Not on file  Stress: Not on  file  Social Connections: Not on file  Intimate Partner Violence: Not on file    Review of Systems: \ All other review of systems negative except as mentioned in the HPI.  Physical Exam: Vital signs BP (!) 142/77   Pulse 64   Temp (!) 96.9 F (36.1 C)   Ht 5\' 6"  (1.676 m)   Wt 245 lb (111.1 kg)   SpO2 100%   BMI 39.54 kg/m   General:   Alert,  Well-developed, well-nourished, pleasant and cooperative in NAD Lungs:  Clear throughout to auscultation.   Heart:  Regular rate and rhythm; no murmurs, clicks, rubs,  or gallops. Abdomen:  Soft, nontender and nondistended. Normal bowel sounds.   Neuro/Psych:  Alert and cooperative. Normal mood and affect. A and O x 3   @Momin Misko  , MD, [facility] [phone] (pager) 12/22/2022 2:50 PM@

## 2022-12-22 NOTE — Progress Notes (Signed)
To pacu, VSS. Report to [email or web address] 

## 2022-12-22 NOTE — Patient Instructions (Addendum)
The upper endoscopy looks fine. Keep avoiding trigger foods that you know bother you.  Also, if you can eat a diet free of very processed foods, especially processed carbohydrates + added sugards (sugary beverages, packaged starches, pasta, bread) and eat fresh or frozen vegetables - not starchy + unprocessed meats, fish and minimally processed foods like cheese, yogurt you should feel better and may lose weight, also. Whole fat dairy is good for you and so are eggs, assuming they do not upset stomach.  Colonoscopy was normal.  I appreciate the opportunity to care for you. Gatha Mayer, MD, FACG   YOU HAD AN ENDOSCOPIC PROCEDURE TODAY AT [facility]:   Refer to the procedure report that was given to you for any specific questions about what was found during the examination.  If the procedure report does not answer your questions, please call your gastroenterologist to clarify.  If you requested that your care partner not be given the details of your procedure findings, then the procedure report has been included in a sealed envelope for you to review at your convenience later.  YOU SHOULD EXPECT: Some feelings of bloating in the abdomen. Passage of more gas than usual.  Walking can help get rid of the air that was put into your GI tract during the procedure and reduce the bloating. If you had a lower endoscopy (such as a colonoscopy or flexible sigmoidoscopy) you may notice spotting of blood in your stool or on the toilet paper. If you underwent a bowel prep for your procedure, you may not have a normal bowel movement for a few days.  Please Note:  You might notice some irritation and congestion in your nose or some drainage.  This is from the oxygen used during your procedure.  There is no need for concern and it should clear up in a day or so.  SYMPTOMS TO REPORT IMMEDIATELY:  Following lower endoscopy (colonoscopy or flexible sigmoidoscopy):  Excessive amounts of blood in  the stool  Significant tenderness or worsening of abdominal pains  Swelling of the abdomen that is new, acute  Fever of 100F or higher  Following upper endoscopy (EGD)  Vomiting of blood or coffee ground material  New chest pain or pain under the shoulder blades  Painful or persistently difficult swallowing  New shortness of breath  Fever of 100F or higher  Black, tarry-looking stools  For urgent or emergent issues, a gastroenterologist can be reached at any hour by calling [phone]. Do not use MyChart messaging for urgent concerns.    DIET:  We do recommend a small meal at first, but then you may proceed to your regular diet.  Drink plenty of fluids but you should avoid alcoholic beverages for 24 hours.  ACTIVITY:  You should plan to take it easy for the rest of today and you should NOT DRIVE or use heavy machinery until tomorrow (because of the sedation medicines used during the test).    FOLLOW UP: Our staff will call the number listed on your records the next business day following your procedure.  We will call around 7:15- 8:00 am to check on you and address any questions or concerns that you may have regarding the information given to you following your procedure. If we do not reach you, we will leave a message.     If any biopsies were taken you will be contacted by phone or by letter within the next 1-3 weeks.  Please call us  at [phone] if you have not heard about the biopsies in 3 weeks.    SIGNATURES/CONFIDENTIALITY: You and/or your care partner have signed paperwork which will be entered into your electronic medical record.  These signatures attest to the fact that that the information above on your After Visit Summary has been reviewed and is understood.  Full responsibility of the confidentiality of this discharge information lies with you and/or your care-partner.

## 2022-12-22 NOTE — Progress Notes (Signed)
Pt's states no medical or surgical changes since previsit or office visit. 

## 2022-12-22 NOTE — Op Note (Signed)
[facility] Patient Name: [name] Procedure Date: 12/22/2022 2:51 PM MRN: [id number] Endoscopist: Gatha Mayer , MD, [id number] Age: 62 Referring MD:  Date of Birth: [birth year] Gender: Female Account #: [id number] Procedure:                Upper GI endoscopy Indications:              Abdominal pain in the right upper quadrant Medicines:                Monitored Anesthesia Care Procedure:                Pre-Anesthesia Assessment:                           - Prior to the procedure, a History and Physical                            was performed, and patient medications and                            allergies were reviewed. The patient's tolerance of                            previous anesthesia was also reviewed. The risks                            and benefits of the procedure and the sedation                            options and risks were discussed with the patient.                            All questions were answered, and informed consent                            was obtained. Prior Anticoagulants: The patient has                            taken no anticoagulant or antiplatelet agents. ASA                            Grade Assessment: III - A patient with severe                            systemic disease. After reviewing the risks and                            benefits, the patient was deemed in satisfactory                            condition to undergo the procedure.                           After obtaining informed consent, the endoscope was  passed under direct vision. Throughout the                            procedure, the patient's blood pressure, pulse, and                            oxygen saturations were monitored continuously. The                            Endoscope was introduced through the mouth, and                            advanced to the second part of duodenum. The upper                            GI  endoscopy was accomplished without difficulty.                            The patient tolerated the procedure well. Scope In: Scope Out: Findings:                 The examined esophagus was normal.                           The gastroesophageal flap valve was visualized                            endoscopically and classified as Hill Grade II                            (fold present, opens with respiration).                           The entire examined stomach was normal.                           The examined duodenum was normal.                           The cardia and gastric fundus were normal on                            retroflexion. Complications:            No immediate complications. Estimated Blood Loss:     Estimated blood loss: none. Impression:               - Normal esophagus.                           - Gastroesophageal flap valve classified as Hill                            Grade II (fold present, opens with respiration).                           - Normal stomach.                           -  Normal examined duodenum.                           - No specimens collected. Recommendation:           - Patient has a contact number available for                            emergencies. The signs and symptoms of potential                            delayed complications were discussed with the                            patient. Return to normal activities tomorrow.                            Written discharge instructions were provided to the                            patient.                           - See the other procedure note for documentation of                            additional recommendations.                           - Continue PPI and avoid dietary triggres, try to                            eat a low-carb minimally processed food diet Gatha Mayer, MD 12/22/2022 3:34:23 PM This report has been signed electronically.

## 2022-12-22 NOTE — Op Note (Signed)
[facility] Patient Name: [name] Procedure Date: 12/22/2022 2:45 PM MRN: [id number] Endoscopist: Gatha Mayer , MD, [id number] Age: 62 Referring MD:  Date of Birth: 04-Jan-1961 Gender: Female Account #: [id number] Procedure:                Colonoscopy Indications:              High risk colon cancer surveillance: Personal                            history of sessile serrated colon polyp (less than                            10 mm in size) with no dysplasia, Last colonoscopy:                            2018 Medicines:                Monitored Anesthesia Care Procedure:                Pre-Anesthesia Assessment:                           - Prior to the procedure, a History and Physical                            was performed, and patient medications and                            allergies were reviewed. The patient's tolerance of                            previous anesthesia was also reviewed. The risks                            and benefits of the procedure and the sedation                            options and risks were discussed with the patient.                            All questions were answered, and informed consent                            was obtained. Prior Anticoagulants: The patient has                            taken no anticoagulant or antiplatelet agents. ASA                            Grade Assessment: III - A patient with severe                            systemic disease. After reviewing the risks and  benefits, the patient was deemed in satisfactory                            condition to undergo the procedure.                           After obtaining informed consent, the colonoscope                            was passed under direct vision. Throughout the                            procedure, the patient's blood pressure, pulse, and                            oxygen saturations were monitored continuously. The                             Olympus Scope [id number] was introduced through the                            anus and advanced to the the cecum, identified by                            appendiceal orifice and ileocecal valve. The                            colonoscopy was performed without difficulty. The                            patient tolerated the procedure well. The quality                            of the bowel preparation was good. The ileocecal                            valve, appendiceal orifice, and rectum were                            photographed. Scope In: 3:07:12 PM Scope Out: 3:17:26 PM Scope Withdrawal Time: 0 hours 7 minutes 50 seconds  Total Procedure Duration: 0 hours 10 minutes 14 seconds  Findings:                 The perianal and digital rectal examinations were                            normal.                           The colon (entire examined portion) appeared normal.                           No additional abnormalities were found on  retroflexion. Complications:            No immediate complications. Estimated Blood Loss:     Estimated blood loss: none. Impression:               - The entire examined colon is normal.                           - No specimens collected. Hx subcm right-sided                            serrated polyp 2018 (hyperplastic) Recommendation:           - Patient has a contact number available for                            emergencies. The signs and symptoms of potential                            delayed complications were discussed with the                            patient. Return to normal activities tomorrow.                            Written discharge instructions were provided to the                            patient.                           - Resume previous diet.                           - Continue present medications.                           - Repeat colonoscopy in 10 years. Iva Boop, MD 12/22/2022 3:36:50 PM This report has been signed electronically.

## 2022-12-23 ENCOUNTER — Telehealth: Payer: Self-pay

## 2022-12-23 NOTE — Telephone Encounter (Signed)
  Follow up Call-     12/22/2022    1:59 PM  Call back number  Post procedure Call Back phone  # [phone]  Permission to leave phone message Yes     Patient questions:  Do you have a fever, pain , or abdominal swelling? No. Pain Score  0 *  Have you tolerated food without any problems? Yes.    Have you been able to return to your normal activities? Yes.    Do you have any questions about your discharge instructions: Diet   No. Medications  No. Follow up visit  No.  Do you have questions or concerns about your Care? No.  Actions: * If pain score is 4 or above: No action needed, pain <4.

## 2022-12-25 ENCOUNTER — Telehealth: Payer: Self-pay | Admitting: Allergy

## 2022-12-25 NOTE — Telephone Encounter (Signed)
Patient has been referred to: Dr. Truddie Coco [facility] [phone] [address] [place], [place] [address] [phone]  I have faxed the referral and all corresponding notes to their office.  They will reach out to patient to schedule.  Patient made aware.

## 2023-01-19 NOTE — Progress Notes (Signed)
Office Visit Note  Patient: [name]             Date of Birth: 15-May-1961           MRN: [id number]             PCP: Lujean Amel, MD Referring: Garnet Sierras, DO Visit Date: 02/02/2023 Occupation: '@GUAROCC'$ @  Subjective:  Pain in multiple joints and positive ANA  History of Present Illness: [name] is a 62 y.o. female seen in consultation per request of Dr. Maudie Mercury.  According the patient she has had intermittent shortness of breath for many years and was given inhalers in the past.  She states that shortness of breath and wheezing is usually exposed to the colder temperatures.  She went for her physical last year at the time her PCP referred her to asthma specialist.  She was seen by Dr. Maudie Mercury who did PFTs and diagnosed her with asthma and gave her inhalers and nasal spray per patient.  She also did some lab work and that came positive for ANA and RNP for that reason she was referred to me.  Patient states that she also has been experiencing itching about twice a week for which she takes Zyrtec.  She did not have any allergy testing.  She also has shellfish allergy.  She states for the last few years she has been having pain in her bilateral Achilles tendon more prominent on the left than the right.  She has seen a podiatrist in the past and was told that she had heel spurs her x-rays were unremarkable otherwise.  She had cortisone injections and physical therapy.  She did not notice any relief from the cortisone injections.  States the physical therapy was helpful and she continues to do stretches at home.  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  None of the other joints are painful.  She has had lower back pain for many years off-and-on.  She denies any radiculopathy.  There is no history of oral ulcers, nasal ulcers, malar rash, Raynaud's, photosensitivity or lymphadenopathy.  She gives history of dry mouth which she  relates to snoring at night.  She has a sleep study coming up.  There is no family history of autoimmune disease.  She is gravida 3 para 2 and miscarriages 1.  There is no history of DVTs.    Activities of Daily Living:  Patient reports morning stiffness for 10 minutes.   Patient Reports nocturnal pain.  Difficulty dressing/grooming: Denies Difficulty climbing stairs: Denies Difficulty getting out of chair: Denies Difficulty using hands for taps, buttons, cutlery, and/or writing: Denies  Review of Systems  Constitutional:  Negative for fatigue.  HENT:  Positive for mouth dryness. Negative for mouth sores.        Snores at night  Eyes: Negative.  Negative for dryness.  Respiratory: Negative.  Negative for shortness of breath.   Cardiovascular:  Positive for palpitations. Negative for chest pain.  Gastrointestinal: Negative.  Negative for blood in stool, constipation and diarrhea.  Endocrine: Negative.  Negative for increased urination.  Genitourinary: Negative.  Negative for involuntary urination.  Musculoskeletal:  Positive for joint pain, joint pain and morning stiffness. Negative for gait problem, joint swelling, myalgias, muscle weakness, muscle tenderness and myalgias.  Skin: Negative.  Negative for color change, rash, hair loss and sensitivity to sunlight.  Allergic/Immunologic: Negative.  Negative for susceptible to infections.  Neurological:  Positive for dizziness. Negative for headaches.  Hematological: Negative.  Negative for swollen glands.  Psychiatric/Behavioral:  Positive for sleep disturbance. Negative for depressed mood. The patient is nervous/anxious.     PMFS History:  Patient Active Problem List   Diagnosis Date Noted   Seasonal and perennial allergic rhinoconjunctivitis 09/28/2022   Anaphylactic reaction due to food, subsequent encounter 09/28/2022   Anxiety 07/06/2022   Other allergy status, other than to drugs and biological substances 07/06/2022    Paresthesia of skin 07/06/2022   Pure hypercholesterolemia 07/06/2022   Shellfish allergy 07/06/2022   Ventricular premature depolarization 07/06/2022   Urticaria 07/06/2022   Moderate persistent asthma 07/06/2022   Pruritus 07/06/2022   Gastroesophageal reflux disease 01/31/2021   Chronic RUQ pain 09/17/2020   Small intestinal bacterial overgrowth 08/08/2019   Excessive daytime sleepiness 02/06/2019   Loud snoring 02/06/2019   Class 2 severe obesity with serious comorbidity and body mass index (BMI) of 38.0 to 38.9 in adult ([place]) 01/10/2019   Vitamin D deficiency 01/10/2019   Heart palpitations 11/11/2016   Chest pain 11/11/2016   Leg pain 11/11/2016   Essential hypertension 03/03/2014    Past Medical History:  Diagnosis Date   Allergy    SEASONAL   Chronic RUQ pain    Colon polyps    hyperplastic - some right sided   Fe deficiency anemia    Foot pain, left    Frequent headaches    GERD (gastroesophageal reflux disease)    Helicobacter pylori antibody positive    Hemorrhoids    Hyperlipidemia    NO MEDS   Hypertension    NO MEDS   IBS (irritable bowel syndrome)    Joint pain    Macular degeneration, left eye    Menopausal symptoms    Mild asthma    Obesity    Palpitations    Pneumonia    Shortness of breath    Small intestinal bacterial overgrowth 08/08/2019   Swelling of both lower extremities     Family History  Problem Relation Age of Onset   Congestive Heart Failure Mother    Diabetes Mother    High blood pressure Mother    Obesity Mother    High blood pressure Father    Diabetes Father    GER disease Sister    Other Sister        Leaky Gut   Breast cancer Maternal Aunt    Breast cancer Cousin    Stomach cancer Neg Hx    Colon cancer Neg Hx    Throat cancer Neg Hx    Pancreatic cancer Neg Hx    Colon polyps Neg Hx    Crohn's disease Neg Hx    Esophageal cancer Neg Hx    Rectal cancer Neg Hx    Ulcerative colitis Neg Hx    Past Surgical  History:  Procedure Laterality Date   ABDOMINAL HYSTERECTOMY  2005   APPENDECTOMY  1984   [place]   COLONOSCOPY     2011 and 2018   DILATION AND CURETTAGE OF UTERUS  1991   ESOPHAGOGASTRODUODENOSCOPY  10/2017   Social History   Social History Narrative   Married, hair stylist   2 children   No EtOH, tobacco, drugs   Immunization History  Administered Date(s) Administered   Influenza Split 12/23/2012   PFIZER(Purple Top)SARS-COV-2 Vaccination 02/12/2020, 03/05/2020   Tdap 05/10/2013     Objective: Vital Signs: BP 121/84 (BP Location: Right Arm,  Patient Position: Sitting, Cuff Size: Large)   Pulse 69   Resp 16   Ht 5' 5.25" (1.657 m)   Wt 232 lb 6.4 oz (105.4 kg)   BMI 38.38 kg/m    Physical Exam Vitals and nursing note reviewed.  Constitutional:      Appearance: She is well-developed.  HENT:     Head: Normocephalic and atraumatic.  Eyes:     Conjunctiva/sclera: Conjunctivae normal.  Cardiovascular:     Rate and Rhythm: Normal rate and regular rhythm.     Heart sounds: Normal heart sounds.  Pulmonary:     Effort: Pulmonary effort is normal.     Breath sounds: Normal breath sounds.  Abdominal:     General: Bowel sounds are normal.     Palpations: Abdomen is soft.  Musculoskeletal:     Cervical back: Normal range of motion.  Lymphadenopathy:     Cervical: No cervical adenopathy.  Skin:    General: Skin is warm and dry.     Capillary Refill: Capillary refill takes less than 2 seconds.  Neurological:     Mental Status: She is alert and oriented to person, place, and time.  Psychiatric:        Behavior: Behavior normal.      Musculoskeletal Exam: Cervical spine was in good range of motion.  She had discomfort range of motion of her lumbar spine.  There was no SI joint tenderness.  Shoulder joints, elbow joints, wrist joints, MCPs PIPs and DIPs been good range of motion with no synovitis.  Hip joints and knee joints were in good range  of motion without any warmth swelling or effusion.  She had tenderness over left Achilles tendon.  There was no tenderness over the right Achilles tendon.  There was no tenderness over MTPs or PIPs send no synovitis was noted.  CDAI Exam: CDAI Score: -- Patient Global: --; Provider Global: -- Swollen: --; Tender: -- Joint Exam 02/02/2023   No joint exam has been documented for this visit   There is currently no information documented on the homunculus. Go to the Rheumatology activity and complete the homunculus joint exam.  Investigation: No additional findings.  Imaging: No results found.  Recent Labs: Lab Results  Component Value Date   WBC 6.2 07/06/2022   HGB 12.9 07/06/2022   PLT 297 07/06/2022   NA 141 07/06/2022   K 4.2 07/06/2022   CL 101 07/06/2022   CO2 28 07/06/2022   GLUCOSE 102 (H) 07/06/2022   BUN 11 07/06/2022   CREATININE 0.64 07/06/2022   BILITOT 0.6 07/06/2022   ALKPHOS 67 07/06/2022   AST 10 07/06/2022   ALT 12 07/06/2022   PROT 7.2 07/06/2022   ALBUMIN 4.2 07/06/2022   CALCIUM 9.0 07/06/2022   GFRAA 97 12/26/2018   July 06, 2022 ANA positive, RNP> 8.0, (Smith, dsDNA, SSA, SSB negative), sed rate 38, CRP 14, TSH normal  Speciality Comments: No specialty comments available.  Procedures:  No procedures performed Allergies: Amoxicillin, Shellfish allergy, and Shellfish-derived products   Assessment / Plan:     Visit Diagnoses: Positive ANA (antinuclear antibody) -patient was found to have positive ANA by Dr. Maudie Mercury and was referred here for further evaluation.  She also has positive RNP.  She denies any history of fatigue, oral ulcers, Raynaud's phenomenon, lymphadenopathy, inflammatory arthritis or photosensitivity.  She relates the dry mouth to snoring at night.  There is no family history of autoimmune disease.  I will obtain following labs today.  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  No synovitis was noted on the examination today.  Plan: Rheumatoid factor, Cyclic citrul peptide antibody, IgG  Achilles tendinitis, left leg she has had problems with recurrent Achilles tendinitis.  She states that left Achilles tendinitis is more frequent than the right.  She had cortisone injections in the past by a podiatrist without much help.  I reviewed her x-rays of bilateral feet from July 2021 done by Dr. Amalia Hailey which were remarkable for osteoarthritis and calcaneal spurs.  She went for physical therapy which was helpful.  She states that she has been doing the stretches at home.  She had mild tenderness over left Achilles tendon today.  She also had some difficulty walking on her tiptoes.  Chronic midline low back pain without sciatica -she complains of lower back pain for many years.  She states the pain is localized to her back.  She was told that she had a scoliosis in the past.  She denies any radiculopathy.  Plan: XR Lumbar Spine 2-3 Views.  Lumbar spine x-rays showed anterior spurring and facet joint arthropathy.  SI joints were not well-visualized.  A handout on back exercises was given.  Pruritus-patient gives history of frequent pruritus for which she takes Zyrtec.  Moderate persistent asthma without complication-patient was evaluated by Dr. Maudie Mercury and was diagnosed with asthma after PFTs.  Patient states she was given inhalers and nasal spray.  Seasonal and perennial allergic rhino-conjunctivitis-she uses nasal spray and Zyrtec.  Essential hypertension-blood pressure was normal at 121/84  today.  Ventricular premature depolarization-followed by cardiology per patient.  Other medical problems are listed as follows:  Pure hypercholesterolemia  History of gastroesophageal reflux (GERD)  Small intestinal bacterial overgrowth  Anxiety  Paresthesia of skin  Shellfish allergy  Class 2 severe obesity with serious comorbidity and body mass index (BMI) of 38.0 to 38.9 in adult, unspecified obesity type ([place])    Orders: Orders Placed This Encounter  Procedures   XR Lumbar Spine 2-3 Views   CBC with Differential/Platelet   COMPLETE METABOLIC PANEL WITH GFR   Protein / creatinine ratio, urine   Sedimentation rate   ANA   Anti-scleroderma antibody   RNP Antibody   Anti-Smith antibody   Sjogrens syndrome-A extractable nuclear antibody   Sjogrens syndrome-B extractable nuclear antibody   Anti-DNA antibody, double-stranded   C3 and C4   Beta-2 glycoprotein antibodies   Cardiolipin antibodies, IgG, IgM, IgA   Rheumatoid factor   Cyclic citrul peptide antibody, IgG   No orders of the defined types were placed in this encounter.    Follow-Up Instructions: No follow-ups on file.   Bo Merino, MD  Note - This record has been created using Editor, commissioning.  Chart creation errors have been sought, but may not always  have been located. Such creation errors do not reflect on  the standard of medical care.

## 2023-02-02 ENCOUNTER — Ambulatory Visit (INDEPENDENT_AMBULATORY_CARE_PROVIDER_SITE_OTHER): Payer: No Typology Code available for payment source

## 2023-02-02 ENCOUNTER — Ambulatory Visit: Payer: No Typology Code available for payment source | Attending: Rheumatology | Admitting: Rheumatology

## 2023-02-02 ENCOUNTER — Encounter: Payer: Self-pay | Admitting: Rheumatology

## 2023-02-02 VITALS — BP 121/84 | HR 69 | Resp 16 | Ht 65.25 in | Wt 232.4 lb

## 2023-02-02 DIAGNOSIS — Z91013 Allergy to seafood: Secondary | ICD-10-CM

## 2023-02-02 DIAGNOSIS — G8929 Other chronic pain: Secondary | ICD-10-CM | POA: Diagnosis not present

## 2023-02-02 DIAGNOSIS — R202 Paresthesia of skin: Secondary | ICD-10-CM

## 2023-02-02 DIAGNOSIS — Z6838 Body mass index (BMI) 38.0-38.9, adult: Secondary | ICD-10-CM

## 2023-02-02 DIAGNOSIS — M7662 Achilles tendinitis, left leg: Secondary | ICD-10-CM

## 2023-02-02 DIAGNOSIS — M545 Low back pain, unspecified: Secondary | ICD-10-CM

## 2023-02-02 DIAGNOSIS — F419 Anxiety disorder, unspecified: Secondary | ICD-10-CM

## 2023-02-02 DIAGNOSIS — G4719 Other hypersomnia: Secondary | ICD-10-CM

## 2023-02-02 DIAGNOSIS — L299 Pruritus, unspecified: Secondary | ICD-10-CM

## 2023-02-02 DIAGNOSIS — I10 Essential (primary) hypertension: Secondary | ICD-10-CM

## 2023-02-02 DIAGNOSIS — Z8719 Personal history of other diseases of the digestive system: Secondary | ICD-10-CM

## 2023-02-02 DIAGNOSIS — K638219 Small intestinal bacterial overgrowth, unspecified: Secondary | ICD-10-CM

## 2023-02-02 DIAGNOSIS — M25531 Pain in right wrist: Secondary | ICD-10-CM

## 2023-02-02 DIAGNOSIS — J302 Other seasonal allergic rhinitis: Secondary | ICD-10-CM

## 2023-02-02 DIAGNOSIS — H1010 Acute atopic conjunctivitis, unspecified eye: Secondary | ICD-10-CM

## 2023-02-02 DIAGNOSIS — J4540 Moderate persistent asthma, uncomplicated: Secondary | ICD-10-CM

## 2023-02-02 DIAGNOSIS — R768 Other specified abnormal immunological findings in serum: Secondary | ICD-10-CM | POA: Diagnosis not present

## 2023-02-02 DIAGNOSIS — E7800 Pure hypercholesterolemia, unspecified: Secondary | ICD-10-CM

## 2023-02-02 DIAGNOSIS — I493 Ventricular premature depolarization: Secondary | ICD-10-CM

## 2023-02-02 DIAGNOSIS — J3089 Other allergic rhinitis: Secondary | ICD-10-CM

## 2023-02-02 NOTE — Patient Instructions (Signed)

## 2023-02-04 NOTE — Progress Notes (Signed)
I will discuss results at the follow-up visit.

## 2023-02-05 ENCOUNTER — Telehealth: Payer: Self-pay | Admitting: Allergy

## 2023-02-05 LAB — CBC WITH DIFFERENTIAL/PLATELET
Absolute Monocytes: 301 cells/uL (ref 200–950)
Basophils Absolute: 31 cells/uL (ref 0–200)
Basophils Relative: 0.6 %
Eosinophils Absolute: 51 cells/uL (ref 15–500)
Eosinophils Relative: 1 %
HCT: 41.9 % (ref 35.0–45.0)
Hemoglobin: 14 g/dL (ref 11.7–15.5)
Lymphs Abs: 1612 cells/uL (ref 850–3900)
MCH: 30 pg (ref 27.0–33.0)
MCHC: 33.4 g/dL (ref 32.0–36.0)
MCV: 89.9 fL (ref 80.0–100.0)
MPV: 10.3 fL (ref 7.5–12.5)
Monocytes Relative: 5.9 %
Neutro Abs: 3106 cells/uL (ref 1500–7800)
Neutrophils Relative %: 60.9 %
Platelets: 321 10*3/uL (ref 140–400)
RBC: 4.66 10*6/uL (ref 3.80–5.10)
RDW: 12.7 % (ref 11.0–15.0)
Total Lymphocyte: 31.6 %
WBC: 5.1 10*3/uL (ref 3.8–10.8)

## 2023-02-05 LAB — CARDIOLIPIN ANTIBODIES, IGG, IGM, IGA
Anticardiolipin IgA: <2 APL-U/mL (ref <20.0)
Anticardiolipin IgG: <2 GPL-U/mL (ref <20.0)
Anticardiolipin IgM: <2 MPL-U/mL (ref <20.0)

## 2023-02-05 LAB — SJOGRENS SYNDROME-A EXTRACTABLE NUCLEAR ANTIBODY: SSA (Ro) (ENA) Antibody, IgG: <1 AI

## 2023-02-05 LAB — COMPLETE METABOLIC PANEL WITH GFR
AG Ratio: 1.3 (calc) (ref 1.0–2.5)
ALT: 12 U/L (ref 6–29)
AST: 11 U/L (ref 10–35)
Albumin: 4.4 g/dL (ref 3.6–5.1)
Alkaline phosphatase (APISO): 63 U/L (ref 37–153)
BUN: 9 mg/dL (ref 7–25)
CO2: 30 mmol/L (ref 20–32)
Calcium: 9.7 mg/dL (ref 8.6–10.4)
Chloride: 104 mmol/L (ref 98–110)
Creat: 0.81 mg/dL (ref 0.50–1.05)
Globulin: 3.5 g/dL (calc) (ref 1.9–3.7)
Glucose, Bld: 92 mg/dL (ref 65–99)
Potassium: 4 mmol/L (ref 3.5–5.3)
Sodium: 143 mmol/L (ref 135–146)
Total Bilirubin: 1 mg/dL (ref 0.2–1.2)
Total Protein: 7.9 g/dL (ref 6.1–8.1)
eGFR: 83 mL/min/{1.73_m2} (ref >=60)

## 2023-02-05 LAB — RNP ANTIBODY: Ribonucleic Protein(ENA) Antibody, IgG: >8 AI — AB

## 2023-02-05 LAB — ANTI-DNA ANTIBODY, DOUBLE-STRANDED: ds DNA Ab: 1 IU/mL

## 2023-02-05 LAB — SJOGRENS SYNDROME-B EXTRACTABLE NUCLEAR ANTIBODY: SSB (La) (ENA) Antibody, IgG: <1 AI

## 2023-02-05 LAB — PROTEIN / CREATININE RATIO, URINE
Creatinine, Urine: 28 mg/dL (ref 20–275)
Total Protein, Urine: <4 mg/dL — ABNORMAL LOW (ref 5–24)

## 2023-02-05 LAB — C3 AND C4
C3 Complement: 161 mg/dL (ref 83–193)
C4 Complement: 36 mg/dL (ref 15–57)

## 2023-02-05 LAB — ANTI-SCLERODERMA ANTIBODY: Scleroderma (Scl-70) (ENA) Antibody, IgG: <1 AI

## 2023-02-05 LAB — BETA-2 GLYCOPROTEIN ANTIBODIES
Beta-2 Glyco 1 IgA: <2 U/mL (ref <20.0)
Beta-2 Glyco 1 IgM: <2 U/mL (ref <20.0)
Beta-2 Glyco I IgG: <2 U/mL (ref <20.0)

## 2023-02-05 LAB — ANA: Anti Nuclear Antibody (ANA): NEGATIVE

## 2023-02-05 LAB — RHEUMATOID FACTOR: Rheumatoid fact SerPl-aCnc: <14 IU/mL (ref <14)

## 2023-02-05 LAB — CYCLIC CITRUL PEPTIDE ANTIBODY, IGG: Cyclic Citrullin Peptide Ab: <16 UNITS

## 2023-02-05 LAB — SEDIMENTATION RATE: Sed Rate: 17 mm/h (ref 0–30)

## 2023-02-05 LAB — ANTI-SMITH ANTIBODY: ENA SM Ab Ser-aCnc: <1 AI

## 2023-02-05 NOTE — Telephone Encounter (Signed)
Patient states her INS is not covering Symbicort and is wondering if there is an alternative that can be sent in.

## 2023-02-05 NOTE — Telephone Encounter (Signed)
Please advise on inhaler change : what seems to be covered is the Advair Diskus, Breztri, Trelegy, and Spiriva inhaler.

## 2023-02-08 MED ORDER — FLUTICASONE-SALMETEROL 100-50 MCG/ACT IN AEPB: 1.0000 | INHALATION_SPRAY | Freq: Two times a day (BID) | RESPIRATORY_TRACT | 3 refills | Status: DC

## 2023-02-08 NOTE — Telephone Encounter (Signed)
Please call patient and dad due to her insurance Symbicort is no longer covered.  Sent in Advair diskus 143mg 1 puff BID. Rinse mouth after each use.   Keep appointment in April so we can reassess her asthma.

## 2023-02-08 NOTE — Telephone Encounter (Signed)
I called patient and informed of change.

## 2023-02-15 ENCOUNTER — Ambulatory Visit: Payer: No Typology Code available for payment source | Admitting: Allergy

## 2023-02-23 ENCOUNTER — Ambulatory Visit: Payer: No Typology Code available for payment source | Admitting: Rheumatology

## 2023-02-24 NOTE — Progress Notes (Signed)
Office Visit Note  Patient: [name]             Date of Birth: May 04, 1961           MRN: [id number]             PCP: Lujean Amel, MD Referring: Lujean Amel, MD Visit Date: 03/03/2023 Occupation: @GUAROCC @  Subjective:  Positive ANA  History of Present Illness: [name] is a 62 y.o. female with history of positive ANA, positive RNP returns for follow-up visit today.  She states that at the right wrist joint pain has improved.  She denies any history of joint swelling.  She states right wrist joint pain was related to her work.  She has been seeing Dr. Maudie Mercury for asthma and her symptoms are well-controlled with inhalers.  She had no recurrence of Planter fasciitis.  She has intermittent lower back pain which is better.  She denies any history of oral ulcers, nasal ulcers, malar rash, photosensitivity, Raynaud's, lymphadenopathy or inflammatory arthritis.    Activities of Daily Living:  Patient reports morning stiffness for 30 minutes.   Patient Denies nocturnal pain.  Difficulty dressing/grooming: Denies Difficulty climbing stairs: Denies Difficulty getting out of chair: Denies Difficulty using hands for taps, buttons, cutlery, and/or writing: Denies  Review of Systems  Constitutional:  Negative for fatigue.  HENT:  Negative for mouth sores and mouth dryness.   Eyes:  Negative for dryness.  Respiratory:  Negative for shortness of breath.   Cardiovascular:  Positive for palpitations. Negative for chest pain.  Gastrointestinal:  Negative for blood in stool, constipation and diarrhea.  Endocrine: Negative for increased urination.  Genitourinary:  Negative for involuntary urination.  Musculoskeletal:  Positive for joint pain, joint pain and morning stiffness. Negative for gait problem, joint swelling, myalgias, muscle weakness, muscle tenderness and myalgias.  Skin:  Negative for color change, rash, hair loss and sensitivity to sunlight.  Allergic/Immunologic:  Negative for susceptible to infections.  Neurological:  Negative for dizziness and headaches.  Hematological:  Negative for swollen glands.  Psychiatric/Behavioral:  Negative for depressed mood and sleep disturbance. The patient is not nervous/anxious.     PMFS History:  Patient Active Problem List   Diagnosis Date Noted   Seasonal and perennial allergic rhinoconjunctivitis 09/28/2022   Anaphylactic reaction due to food, subsequent encounter 09/28/2022   Anxiety 07/06/2022   Other allergy status, other than to drugs and biological substances 07/06/2022   Paresthesia of skin 07/06/2022   Pure hypercholesterolemia 07/06/2022   Shellfish allergy 07/06/2022   Ventricular premature depolarization 07/06/2022   Urticaria 07/06/2022   Moderate persistent asthma 07/06/2022   Pruritus 07/06/2022   Gastroesophageal reflux disease 01/31/2021   Chronic RUQ pain 09/17/2020   Small intestinal bacterial overgrowth 08/08/2019   Excessive daytime sleepiness 02/06/2019   Loud snoring 02/06/2019   Class 2 severe obesity with serious comorbidity and body mass index (BMI) of 38.0 to 38.9 in adult 01/10/2019   Vitamin D deficiency 01/10/2019   Heart palpitations 11/11/2016   Chest pain 11/11/2016   Leg pain 11/11/2016   Essential hypertension 03/03/2014    Past Medical History:  Diagnosis Date   Allergy    SEASONAL   Chronic RUQ pain    Colon polyps    hyperplastic - some right sided   Fe deficiency anemia    Foot pain, left    Frequent headaches    GERD (gastroesophageal reflux disease)    Helicobacter pylori antibody positive    Hemorrhoids  Hyperlipidemia    NO MEDS   Hypertension    NO MEDS   IBS (irritable bowel syndrome)    Joint pain    Macular degeneration, left eye    Menopausal symptoms    Mild asthma    Obesity    Palpitations    Pneumonia    Shortness of breath    Small intestinal bacterial overgrowth 08/08/2019   Swelling of both lower extremities     Family  History  Problem Relation Age of Onset   Congestive Heart Failure Mother    Diabetes Mother    High blood pressure Mother    Obesity Mother    High blood pressure Father    Diabetes Father    GER disease Sister    Other Sister        Leaky Gut   Breast cancer Maternal Aunt    Breast cancer Cousin    Stomach cancer Neg Hx    Colon cancer Neg Hx    Throat cancer Neg Hx    Pancreatic cancer Neg Hx    Colon polyps Neg Hx    Crohn's disease Neg Hx    Esophageal cancer Neg Hx    Rectal cancer Neg Hx    Ulcerative colitis Neg Hx    Past Surgical History:  Procedure Laterality Date   ABDOMINAL HYSTERECTOMY  2005   APPENDECTOMY  1984   CERVICAL CANCER CELLS FROZEN  1991   COLONOSCOPY     2011 and 2018   DILATION AND CURETTAGE OF UTERUS  1991   ESOPHAGOGASTRODUODENOSCOPY  10/2017   Social History   Social History Narrative   Married, hair stylist   2 children   No EtOH, tobacco, drugs   Immunization History  Administered Date(s) Administered   Influenza Split 12/23/2012   PFIZER(Purple Top)SARS-COV-2 Vaccination 02/12/2020, 03/05/2020   Tdap 05/10/2013     Objective: Vital Signs: BP 122/79 (BP Location: Left Arm, Patient Position: Sitting, Cuff Size: Normal)   Pulse 66   Resp 15   Ht 5' 5.5" (1.664 m)   Wt 230 lb (104.3 kg)   BMI 37.69 kg/m    Physical Exam Vitals and nursing note reviewed.  Constitutional:      Appearance: She is well-developed.  HENT:     Head: Normocephalic and atraumatic.  Eyes:     Conjunctiva/sclera: Conjunctivae normal.  Cardiovascular:     Rate and Rhythm: Normal rate and regular rhythm.     Heart sounds: Normal heart sounds.  Pulmonary:     Effort: Pulmonary effort is normal.     Breath sounds: Normal breath sounds.  Abdominal:     General: Bowel sounds are normal.     Palpations: Abdomen is soft.  Musculoskeletal:     Cervical back: Normal range of motion.  Lymphadenopathy:     Cervical: No cervical adenopathy.  Skin:     General: Skin is warm and dry.     Capillary Refill: Capillary refill takes less than 2 seconds.  Neurological:     Mental Status: She is alert and oriented to person, place, and time.  Psychiatric:        Behavior: Behavior normal.      Musculoskeletal Exam: Cervical, thoracic and lumbar spine were in good range of motion.  Shoulder joints, elbow joints, wrist joints, MCPs PIPs and DIPs with good range of motion with no synovitis.  Hip joints, knee joints, ankles, MTPs and PIPs with good range of motion with no synovitis.  CDAI  Exam: CDAI Score: -- Patient Global: --; Provider Global: -- Swollen: --; Tender: -- Joint Exam 03/03/2023   No joint exam has been documented for this visit   There is currently no information documented on the homunculus. Go to the Rheumatology activity and complete the homunculus joint exam.  Investigation: No additional findings.  Imaging: XR Lumbar Spine 2-3 Views  Result Date: 02/02/2023 Dextroscoliosis was noted.  No significant disc space narrowing was noted.  Anterior spurring was noted.  SI joints were not well-visualized in this view.  Facet joint arthropathy was noted. 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   Recent Labs: Lab Results  Component Value Date   WBC 5.1 02/02/2023   HGB 14.0 02/02/2023   PLT 321 02/02/2023   NA 143 02/02/2023   K 4.0 02/02/2023   CL 104 02/02/2023   CO2 30 02/02/2023   GLUCOSE 92 02/02/2023   BUN 9 02/02/2023   CREATININE 0.81 02/02/2023   BILITOT 1.0 02/02/2023   ALKPHOS 67 07/06/2022   AST 11 02/02/2023   ALT 12 02/02/2023   PROT 7.9 02/02/2023   ALBUMIN 4.2 07/06/2022   CALCIUM 9.7 02/02/2023   GFRAA 97 12/26/2018   February 02, 2023 ANA negative, ENA (SCL 70, Smith, SSA, SSB, dsDNA) negative, RNP > 8.0, C3-C4 normal, anticardiolipin negative, beta-2 GP 1 negative, urine protein creatinine ratio normal, RF negative, anti-CCP negative  Speciality Comments: No  specialty comments available.  Procedures:  No procedures performed Allergies: Amoxicillin, Shellfish allergy, and Shellfish-derived products   Assessment / Plan:     Visit Diagnoses: Positive ANA (antinuclear antibody) - Repeat ANA negative, RNP> 8.0, no clinical features of autoimmune disease.  Her RNP is positive.  We had a detailed discussion regarding association of RNP with mixed connective tissue disease and ILD.  She states denies any shortness of breath.  Her symptoms are well-controlled with inhalers.  Patient had PFTs by Dr. Maudie Mercury.  Advised patient to contact us if she develops any new symptoms.  I will recheck RNP in 1 year.  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  The symptoms have improved.  All autoimmune workup negative.  Achilles tendinitis, left leg - History of recurrent Achilles tendinitis.  Followed by a podiatrist.  Previous x-rays showed osteoarthritic changes and calcaneal spurs.  Physical therapy helped.  DDD (degenerative disc disease), lumbar - History of lower back pain.  X-rays showed anterior osteophytes and facet joint arthropathy.  SI joints were unremarkable.  Core strengthening exercises were discussed.  Pruritus - History of frequent pruritus which responds to Zyrtec.  Moderate persistent asthma without complication - Followed by Dr. Maudie Mercury.  Seasonal and perennial allergic rhinoconjunctivitis  Essential hypertension-blood pressure was normal at 122/79 today.  Ventricular premature depolarization  Pure hypercholesterolemia  Small intestinal bacterial overgrowth  History of gastroesophageal reflux (GERD)  Paresthesia of skin  Anxiety  Shellfish allergy  Orders: No orders of the defined types were placed in this encounter.  No orders of the defined types were placed in this encounter.   Follow-Up Instructions: Return in about 1 year (around 03/02/2024) for +RNP.   Bo Merino, MD  Note - This record has been created  using Editor, commissioning.  Chart creation errors have been sought, but may not always  have been located. Such creation errors do not reflect on  the standard of medical care.

## 2023-03-03 ENCOUNTER — Ambulatory Visit: Payer: No Typology Code available for payment source | Attending: Rheumatology | Admitting: Rheumatology

## 2023-03-03 ENCOUNTER — Encounter: Payer: Self-pay | Admitting: Rheumatology

## 2023-03-03 VITALS — BP 122/79 | HR 66 | Resp 15 | Ht 65.5 in | Wt 230.0 lb

## 2023-03-03 DIAGNOSIS — Z8719 Personal history of other diseases of the digestive system: Secondary | ICD-10-CM

## 2023-03-03 DIAGNOSIS — M5136 Other intervertebral disc degeneration, lumbar region: Secondary | ICD-10-CM | POA: Diagnosis not present

## 2023-03-03 DIAGNOSIS — K638219 Small intestinal bacterial overgrowth, unspecified: Secondary | ICD-10-CM

## 2023-03-03 DIAGNOSIS — R768 Other specified abnormal immunological findings in serum: Secondary | ICD-10-CM

## 2023-03-03 DIAGNOSIS — M25531 Pain in right wrist: Secondary | ICD-10-CM

## 2023-03-03 DIAGNOSIS — M7662 Achilles tendinitis, left leg: Secondary | ICD-10-CM | POA: Diagnosis not present

## 2023-03-03 DIAGNOSIS — J3089 Other allergic rhinitis: Secondary | ICD-10-CM

## 2023-03-03 DIAGNOSIS — F419 Anxiety disorder, unspecified: Secondary | ICD-10-CM

## 2023-03-03 DIAGNOSIS — E7800 Pure hypercholesterolemia, unspecified: Secondary | ICD-10-CM

## 2023-03-03 DIAGNOSIS — H1010 Acute atopic conjunctivitis, unspecified eye: Secondary | ICD-10-CM

## 2023-03-03 DIAGNOSIS — R202 Paresthesia of skin: Secondary | ICD-10-CM

## 2023-03-03 DIAGNOSIS — Z91013 Allergy to seafood: Secondary | ICD-10-CM

## 2023-03-03 DIAGNOSIS — J302 Other seasonal allergic rhinitis: Secondary | ICD-10-CM

## 2023-03-03 DIAGNOSIS — J4540 Moderate persistent asthma, uncomplicated: Secondary | ICD-10-CM

## 2023-03-03 DIAGNOSIS — L299 Pruritus, unspecified: Secondary | ICD-10-CM

## 2023-03-03 DIAGNOSIS — I10 Essential (primary) hypertension: Secondary | ICD-10-CM

## 2023-03-03 DIAGNOSIS — I493 Ventricular premature depolarization: Secondary | ICD-10-CM

## 2023-03-03 DIAGNOSIS — M51369 Other intervertebral disc degeneration, lumbar region without mention of lumbar back pain or lower extremity pain: Secondary | ICD-10-CM

## 2023-03-22 ENCOUNTER — Ambulatory Visit: Payer: No Typology Code available for payment source | Admitting: Allergy

## 2023-03-30 NOTE — Progress Notes (Unsigned)
Follow Up Note  RE: [name] MRN: [id number] DOB: 1961-08-15 Date of Office Visit: 03/31/2023  Referring provider: [name], MD Primary care provider: [name], MD  Chief Complaint: No chief complaint on file.  History of Present Illness: I had the pleasure of seeing [name] for a follow up visit at the [facility] on 03/30/2023. She is a 62 y.o. female, who is being followed for asthma, allergic rhinoconjunctivitis, food allergy, GERD and pruritus. Her previous allergy office visit was on 12/07/2022 with Dr. Selena Batten. Today is a regular follow up visit.  Moderate persistent asthma Past history - Coughing, wheezing for 5+ yrs. Triggers include weather change. Using albuterol on rare occasions. Covid-19 in 2023 and 2022. Takes PPI for GERD. 2023 spirometry showed moderate obstruction with 31% improvement in FEV1 post bronchodilator treatment. Clinically feeling unchanged.  Interim history - unsure if daily Flovent is helping. 2023 CXR was normal.  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 Patient seems to be an underperceiver of her symptoms.  Will do 1 month trail of ICS/LABA inhaler.  Daily controller medication(s): start Symbicort 2 puffs twice a day with spacer and rinse mouth afterwards. STOP Flovent for now.  May use albuterol rescue inhaler 2 puffs every 4 to 6 hours as needed for shortness of breath, chest tightness, coughing, and wheezing. Monitor frequency of use.  Get spirometry at next visit.   Seasonal and perennial allergic rhinoconjunctivitis Past history - Perennial rhinoconjunctivitis symptoms for 10 years.  Uses Zyrtec and Flonase with some benefit.  No prior ENT evaluation. 2023 skin prick testing showed: Positive to weed pollen. 2023 bloodwork positive to dust mites, grass, cockroach, trees, ragweed. Interim history - sinus congestion unchanged.  Continue environmental control measures as  below. Use over the counter antihistamines such as Claritin (loratadine), Allegra (fexofenadine), or Xyzal (levocetirizine) daily as needed. May take twice a day during allergy flares. May switch antihistamines every few months. Use azelastine nasal spray 1-2 sprays per nostril twice a day as needed for runny nose/drainage. Use Flonase (fluticasone) nasal spray 1 spray per nostril twice a day as needed for nasal congestion.  Nasal saline spray (i.e., Simply Saline) or nasal saline lavage (i.e., NeilMed) is recommended as needed and prior to medicated nasal sprays. Consider allergy injections for long term control if above medications do not help the symptoms.  Consider starting Singulair.  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    Anaphylactic reaction due to food, subsequent encounter Past history - Shellfish caused hives and trouble breathing in the past. Avoiding chocolate, broccoli and red meat due to worsening acid reflux. 2023 skin prick testing was negative to select foods but positive control was borderline. 2023 bloodwork was positive to shellfish and mollusks. Interim history - no reactions.  Continue to avoid foods that are bothersome - shellfish, mollusks. Monitor symptoms after eating eggs.  For mild symptoms you can take over the counter antihistamines such as Benadryl and monitor symptoms closely. If symptoms worsen or if you have severe symptoms including breathing issues, throat closure, significant swelling, whole body hives, severe diarrhea and vomiting, lightheadedness then inject epinephrine and seek immediate medical care afterwards. Action plan in place.    Gastroesophageal reflux disease Only taking meds prn.  Continue lifestyle and dietary modifications. Take Protonix 40mg  daily in the morning. Nothing to eat or drink for 30 minutes afterwards.    Pruritus Past history - 2023 bloodwork positive ANA with elevated RNP antibody.  Interim history - has appointment with  rheum later thi year. Symptom unchanged.  Continue proper skincare. Follow up with rheumatology.    Return in about 2 months (around 02/05/2023).  Assessment and Plan: [name] is a 62 y.o. female with: No problem-specific Assessment & Plan notes found for this encounter.  No follow-ups on file.  No orders of the defined types were placed in this encounter.  Lab Orders  No laboratory test(s) ordered today    Diagnostics: Spirometry:  Tracings reviewed. Her effort: {Blank single:19197::"Good reproducible efforts.","It was hard to get consistent efforts and there is a question as to whether this reflects a maximal maneuver.","Poor effort, data can not be interpreted."} FVC: ***L FEV1: ***L, ***% predicted FEV1/FVC ratio: ***% Interpretation: {Blank single:19197::"Spirometry consistent with mild obstructive disease","Spirometry consistent with moderate obstructive disease","Spirometry consistent with severe obstructive disease","Spirometry consistent with possible restrictive disease","Spirometry consistent with mixed obstructive and restrictive disease","Spirometry uninterpretable due to technique","Spirometry consistent with normal pattern","No overt abnormalities noted given today's efforts"}.  Please see scanned spirometry results for details.  Skin Testing: {Blank single:19197::"Select foods","Environmental allergy panel","Environmental allergy panel and select foods","Food allergy panel","None","Deferred due to recent antihistamines use"}. *** Results discussed with patient/family.   Medication List:  Current Outpatient Medications  Medication Sig Dispense Refill   albuterol (PROVENTIL HFA;VENTOLIN HFA) 108 (90 Base) MCG/ACT inhaler Inhale 2 puffs into the lungs every 6 (six) hours as needed for wheezing or shortness of breath.     ALOE VERA JUICE PO Take by mouth as needed. 2 oz prn     azelastine (ASTELIN) 0.1 % nasal spray Place 2 sprays into both nostrils 2 (two) times daily.  (Patient not taking: Reported on 03/03/2023) 30 mL 5   b complex vitamins capsule Take 1 capsule by mouth daily.     budesonide (PULMICORT FLEXHALER) 180 MCG/ACT inhaler INHALE 1 PUFF INTO THE LUNGS DAILY. RINSE MOUTH AFTER USE     cetirizine (ZYRTEC) 10 MG tablet Take 10 mg by mouth daily as needed.     Cholecalciferol (VITAMIN D3) 125 MCG (5000 UT) CAPS Take 1 capsule by mouth daily.     Digestive Enzymes (DIGESTIVE ENZYME PO) Take by mouth as needed.     EPINEPHrine 0.3 mg/0.3 mL IJ SOAJ injection See admin instructions.     fluticasone (FLONASE) 50 MCG/ACT nasal spray Place 2 sprays into both nostrils daily. 16 g 5   fluticasone-salmeterol (ADVAIR) 100-50 MCG/ACT AEPB Inhale 1 puff into the lungs 2 (two) times daily. Rinse mouth after each use. 60 each 3   MAGNESIUM PO Take 120 mg by mouth daily.     pantoprazole (PROTONIX) 40 MG tablet Take 40 mg by mouth daily as needed.     Spacer/Aero-Holding Chambers (AEROCHAMBER MV) inhaler Use as instructed 1 each 2   sucralfate (CARAFATE) 1 g tablet Take 1 g by mouth 4 (four) times daily as needed.     No current facility-administered medications for this visit.   Allergies: Allergies  Allergen Reactions   Amoxicillin Hives, Swelling and Other (See Comments)   Shellfish Allergy Hives    Any seafood.   Shellfish-Derived Products     Other reaction(s): hives/swelling   I reviewed her past medical history, social history, family history, and environmental history and no significant changes have been reported from her previous visit.  Review of Systems  Constitutional:  Negative for appetite change, chills, fever and unexpected weight change.  HENT:  Positive for congestion, sinus pressure and sinus pain. Negative for postnasal drip and rhinorrhea.  Eyes:  Positive for itching.  Respiratory:  Negative for cough, chest tightness, shortness of breath and wheezing.   Cardiovascular:  Negative for chest pain.  Gastrointestinal:  Negative for  abdominal pain.  Genitourinary:  Negative for difficulty urinating.  Skin:  Negative for rash.  Allergic/Immunologic: Positive for environmental allergies and food allergies.  Neurological:  Negative for headaches.    Objective: There were no vitals taken for this visit. There is no height or weight on file to calculate BMI. Physical Exam Vitals and nursing note reviewed.  Constitutional:      Appearance: Normal appearance. She is well-developed. She is obese.  HENT:     Head: Normocephalic and atraumatic.     Right Ear: Tympanic membrane and external ear normal.     Left Ear: Tympanic membrane and external ear normal.     Nose: Congestion (turbinate hypertrophy b/l) present.     Mouth/Throat:     Mouth: Mucous membranes are moist.     Pharynx: Oropharynx is clear.  Eyes:     Conjunctiva/sclera: Conjunctivae normal.  Cardiovascular:     Rate and Rhythm: Normal rate and regular rhythm.     Heart sounds: Normal heart sounds. No murmur heard.    No friction rub. No gallop.  Pulmonary:     Effort: Pulmonary effort is normal.     Breath sounds: Normal breath sounds. No wheezing, rhonchi or rales.  Musculoskeletal:     Cervical back: Neck supple.  Skin:    General: Skin is warm.     Findings: No rash.  Neurological:     Mental Status: She is alert and oriented to person, place, and time.  Psychiatric:        Behavior: Behavior normal.    Previous notes and tests were reviewed. The plan was reviewed with the patient/family, and all questions/concerned were addressed.  It was my pleasure to see [name] today and participate in her care. Please feel free to contact me with any questions or concerns.  Sincerely,  Wyline Mood, DO Allergy & Immunology  [facility] office: [phone] [facility] office: [phone]

## 2023-03-31 ENCOUNTER — Ambulatory Visit (INDEPENDENT_AMBULATORY_CARE_PROVIDER_SITE_OTHER): Payer: No Typology Code available for payment source | Admitting: Allergy

## 2023-03-31 ENCOUNTER — Other Ambulatory Visit: Payer: Self-pay

## 2023-03-31 ENCOUNTER — Encounter: Payer: Self-pay | Admitting: Allergy

## 2023-03-31 VITALS — BP 142/80 | HR 61 | Temp 98.1°F | Resp 16 | Wt 230.8 lb

## 2023-03-31 DIAGNOSIS — J3089 Other allergic rhinitis: Secondary | ICD-10-CM

## 2023-03-31 DIAGNOSIS — H1013 Acute atopic conjunctivitis, bilateral: Secondary | ICD-10-CM

## 2023-03-31 DIAGNOSIS — T7800XD Anaphylactic reaction due to unspecified food, subsequent encounter: Secondary | ICD-10-CM | POA: Diagnosis not present

## 2023-03-31 DIAGNOSIS — K219 Gastro-esophageal reflux disease without esophagitis: Secondary | ICD-10-CM

## 2023-03-31 DIAGNOSIS — L299 Pruritus, unspecified: Secondary | ICD-10-CM

## 2023-03-31 DIAGNOSIS — J302 Other seasonal allergic rhinitis: Secondary | ICD-10-CM

## 2023-03-31 DIAGNOSIS — H1010 Acute atopic conjunctivitis, unspecified eye: Secondary | ICD-10-CM

## 2023-03-31 DIAGNOSIS — J4540 Moderate persistent asthma, uncomplicated: Secondary | ICD-10-CM

## 2023-03-31 NOTE — Assessment & Plan Note (Signed)
Asymptomatic with no meds. Continue lifestyle and dietary modifications.

## 2023-03-31 NOTE — Assessment & Plan Note (Signed)
Past history - Shellfish caused hives and trouble breathing in the past. Avoiding chocolate, broccoli and red meat due to worsening acid reflux. 2023 skin prick testing was negative to select foods but positive control was borderline. 2023 bloodwork was positive to shellfish and mollusks. Continue to avoid foods that are bothersome - shellfish, mollusks. For mild symptoms you can take over the counter antihistamines such as Benadryl and monitor symptoms closely. If symptoms worsen or if you have severe symptoms including breathing issues, throat closure, significant swelling, whole body hives, severe diarrhea and vomiting, lightheadedness then inject epinephrine and seek immediate medical care afterwards. Action plan in place.

## 2023-03-31 NOTE — Assessment & Plan Note (Signed)
Past history - Coughing, wheezing for 5+ yrs. Triggers include weather change. Using albuterol on rare occasions. Covid-19 in 2023 and 2022. H/o GERD. 2023 spirometry showed moderate obstruction with 31% improvement in FEV1 post bronchodilator treatment. Clinically feeling unchanged. 2023 CXR normal.  Interim history - Symbicort not covered. Picked up Advair but didn't take it. [place] the [place] and needed to take albuterol. No prednisone.  Today's spirometry showed moderate obstruction.  Daily controller medication(s): start fluticasone-salmeterol inhaler 1 puff twice a day and rinse mouth afterwards.  Use for 2 months and then follow up with me. May use albuterol rescue inhaler 2 puffs every 4 to 6 hours as needed for shortness of breath, chest tightness, coughing, and wheezing. Monitor frequency of use.  Get spirometry at next visit.

## 2023-03-31 NOTE — Patient Instructions (Addendum)
Asthma Daily controller medication(s): start fluticasone-salmeterol inhaler 1 puff twice a day and rinse mouth afterwards.  Use for 2 months and then follow up with me. May use albuterol rescue inhaler 2 puffs every 4 to 6 hours as needed for shortness of breath, chest tightness, coughing, and wheezing. Monitor frequency of use.  Asthma control goals:  Full participation in all desired activities (may need albuterol before activity) Albuterol use two times or less a week on average (not counting use with activity) Cough interfering with sleep two times or less a month Oral steroids no more than once a year No hospitalizations   Environmental allergies 2023 allergy testing: positive to dust mites, grass, cockroach, trees, ragweed. Continue environmental control measures as below. Use over the counter antihistamines such as Claritin (loratadine), Allegra (fexofenadine), or Xyzal (levocetirizine) daily as needed. May take twice a day during allergy flares. May switch antihistamines every few months. Use Flonase (fluticasone) nasal spray 1 spray per nostril twice a day as needed for nasal congestion.  Nasal saline spray (i.e., Simply Saline) or nasal saline lavage (i.e., NeilMed) is recommended as needed and prior to medicated nasal sprays. Consider allergy injections for long term control if above medications do not help the symptoms.  Consider ENT evaluation for turbinate hypertrophy.   Itching Continue proper skincare.  Food Continue to avoid foods that are bothersome - shellfish, mollusks. For mild symptoms you can take over the counter antihistamines such as Benadryl and monitor symptoms closely. If symptoms worsen or if you have severe symptoms including breathing issues, throat closure, significant swelling, whole body hives, severe diarrhea and vomiting, lightheadedness then inject epinephrine and seek immediate medical care afterwards. Action plan in place.   Heartburn: Continue  lifestyle and dietary modifications.  Follow up in 2 months or sooner if needed.    Control of House Dust Mite Allergen Dust mite allergens are a common trigger of allergy and asthma symptoms. While they can be found throughout the house, these microscopic creatures thrive in warm, humid environments such as bedding, upholstered furniture and carpeting. Because so much time is spent in the bedroom, it is essential to reduce mite levels there.  Encase pillows, mattresses, and box springs in special allergen-proof fabric covers or airtight, zippered plastic covers.  Bedding should be washed weekly in hot water (130 F) and dried in a hot dryer. Allergen-proof covers are available for comforters and pillows that can't be regularly washed.  Wash the allergy-proof covers every few months. Minimize clutter in the bedroom. Keep pets out of the bedroom.  Keep humidity less than 50% by using a dehumidifier or air conditioning. You can buy a humidity measuring device called a hygrometer to monitor this.  If possible, replace carpets with hardwood, linoleum, or washable area rugs. If that's not possible, vacuum frequently with a vacuum that has a HEPA filter. Remove all upholstered furniture and non-washable window drapes from the bedroom. Remove all non-washable stuffed toys from the bedroom.  Wash stuffed toys weekly. Reducing Pollen Exposure Pollen seasons: trees (spring), grass (summer) and ragweed/weeds (fall). Keep windows closed in your home and car to lower pollen exposure.  Install air conditioning in the bedroom and throughout the house if possible.  Avoid going out in dry windy days - especially early morning. Pollen counts are highest between 5 - 10 AM and on dry, hot and windy days.  Save outside activities for late afternoon or after a heavy rain, when pollen levels are lower.  Avoid mowing of grass  if you have grass pollen allergy. Be aware that pollen can also be transported indoors on  people and pets.  Dry your clothes in an automatic dryer rather than hanging them outside where they might collect pollen.  Rinse hair and eyes before bedtime. Cockroach Allergen Avoidance Cockroaches are often found in the homes of densely populated urban areas, schools or commercial buildings, but these creatures can lurk almost anywhere. This does not mean that you have a dirty house or living area. Block all areas where roaches can enter the home. This includes crevices, wall cracks and windows.  Cockroaches need water to survive, so fix and seal all leaky faucets and pipes. Have an exterminator go through the house when your family and pets are gone to eliminate any remaining roaches. Keep food in lidded containers and put pet food dishes away after your pets are done eating. Vacuum and sweep the floor after meals, and take out garbage and recyclables. Use lidded garbage containers in the kitchen. Wash dishes immediately after use and clean under stoves, refrigerators or toasters where crumbs can accumulate. Wipe off the stove and other kitchen surfaces and cupboards regularly.

## 2023-03-31 NOTE — Assessment & Plan Note (Signed)
Past history - 2023 bloodwork positive ANA with elevated RNP antibody.  Interim history - saw rheum, just monitoring symptoms.  Continue proper skincare. Continue antihistamine as above.

## 2023-03-31 NOTE — Assessment & Plan Note (Signed)
Past history - Perennial rhinoconjunctivitis symptoms for 10 years.  Uses Zyrtec and Flonase with some benefit.  No prior ENT evaluation. 2023 skin prick testing showed: Positive to weed pollen. 2023 bloodwork positive to dust mites, grass, cockroach, trees, ragweed. Interim history - sinus congestion unchanged. Didn't see ENT.  Continue environmental control measures as below. Use over the counter antihistamines such as Claritin (loratadine), Allegra (fexofenadine), or Xyzal (levocetirizine) daily as needed. May take twice a day during allergy flares. May switch antihistamines every few months. Use Flonase (fluticasone) nasal spray 1 spray per nostril twice a day as needed for nasal congestion.  Nasal saline spray (i.e., Simply Saline) or nasal saline lavage (i.e., NeilMed) is recommended as needed and prior to medicated nasal sprays. Consider allergy injections for long term control if above medications do not help the symptoms.  Consider ENT evaluation for turbinate hypertrophy - will hold off referral per patient request.

## 2023-04-24 ENCOUNTER — Emergency Department (HOSPITAL_BASED_OUTPATIENT_CLINIC_OR_DEPARTMENT_OTHER)
Admission: EM | Admit: 2023-04-24 | Discharge: 2023-04-24 | Disposition: A | Discharge status: Home / Self Care | Payer: No Typology Code available for payment source | Attending: Emergency Medicine | Admitting: Emergency Medicine

## 2023-04-24 ENCOUNTER — Emergency Department (HOSPITAL_BASED_OUTPATIENT_CLINIC_OR_DEPARTMENT_OTHER): Payer: No Typology Code available for payment source

## 2023-04-24 ENCOUNTER — Other Ambulatory Visit: Payer: Self-pay

## 2023-04-24 DIAGNOSIS — I10 Essential (primary) hypertension: Secondary | ICD-10-CM | POA: Insufficient documentation

## 2023-04-24 DIAGNOSIS — J45909 Unspecified asthma, uncomplicated: Secondary | ICD-10-CM | POA: Diagnosis not present

## 2023-04-24 DIAGNOSIS — K529 Noninfective gastroenteritis and colitis, unspecified: Secondary | ICD-10-CM | POA: Diagnosis not present

## 2023-04-24 DIAGNOSIS — Z79899 Other long term (current) drug therapy: Secondary | ICD-10-CM | POA: Diagnosis not present

## 2023-04-24 DIAGNOSIS — Z7951 Long term (current) use of inhaled steroids: Secondary | ICD-10-CM | POA: Diagnosis not present

## 2023-04-24 DIAGNOSIS — R1013 Epigastric pain: Secondary | ICD-10-CM | POA: Diagnosis present

## 2023-04-24 LAB — COMPREHENSIVE METABOLIC PANEL
ALT: 19 U/L (ref 0–44)
AST: 39 U/L (ref 15–41)
Albumin: 4.2 g/dL (ref 3.5–5.0)
Alkaline Phosphatase: 65 U/L (ref 38–126)
Anion gap: 9 (ref 5–15)
BUN: 19 mg/dL (ref 6–20)
CO2: 26 mmol/L (ref 22–32)
Calcium: 9.1 mg/dL (ref 8.9–10.3)
Chloride: 105 mmol/L (ref 98–111)
Creatinine, Ser: 0.78 mg/dL (ref 0.44–1.00)
GFR, Estimated: >60 mL/min (ref >60)
Glucose, Bld: 125 mg/dL — ABNORMAL HIGH (ref 70–99)
Potassium: 3.4 mmol/L — ABNORMAL LOW (ref 3.5–5.1)
Sodium: 140 mmol/L (ref 135–145)
Total Bilirubin: 0.6 mg/dL (ref 0.3–1.2)
Total Protein: 7.6 g/dL (ref 6.5–8.1)

## 2023-04-24 LAB — CBC
HCT: 40.6 % (ref 36.0–46.0)
Hemoglobin: 13.3 g/dL (ref 12.0–15.0)
MCH: 30.6 pg (ref 26.0–34.0)
MCHC: 32.8 g/dL (ref 30.0–36.0)
MCV: 93.3 fL (ref 80.0–100.0)
Platelets: 255 10*3/uL (ref 150–400)
RBC: 4.35 MIL/uL (ref 3.87–5.11)
RDW: 13.1 % (ref 11.5–15.5)
WBC: 11.6 10*3/uL — ABNORMAL HIGH (ref 4.0–10.5)
nRBC: 0 % (ref 0.0–0.2)

## 2023-04-24 LAB — DIFFERENTIAL
Abs Immature Granulocytes: 0.04 10*3/uL (ref 0.00–0.07)
Basophils Absolute: 0 10*3/uL (ref 0.0–0.1)
Basophils Relative: 0 %
Eosinophils Absolute: 0.1 10*3/uL (ref 0.0–0.5)
Eosinophils Relative: 0 %
Immature Granulocytes: 0 %
Lymphocytes Relative: 15 %
Lymphs Abs: 1.7 10*3/uL (ref 0.7–4.0)
Monocytes Absolute: 0.5 10*3/uL (ref 0.1–1.0)
Monocytes Relative: 4 %
Neutro Abs: 8.9 10*3/uL — ABNORMAL HIGH (ref 1.7–7.7)
Neutrophils Relative %: 81 %

## 2023-04-24 LAB — LIPASE, BLOOD: Lipase: 15 U/L (ref 11–51)

## 2023-04-24 MED ORDER — DICYCLOMINE HCL 20 MG PO TABS: 20.0000 mg | ORAL_TABLET | Freq: Four times a day (QID) | ORAL | 0 refills | Status: DC | PRN

## 2023-04-24 MED ORDER — ONDANSETRON HCL 4 MG/2ML IJ SOLN: 4.0000 mg | Freq: Once | INTRAMUSCULAR | Status: DC | PRN

## 2023-04-24 MED ORDER — PANTOPRAZOLE SODIUM 40 MG IV SOLR
40.0000 mg | Freq: Once | INTRAVENOUS | Status: AC
  Administered 2023-04-24: 40 mg via INTRAVENOUS
  Filled 2023-04-24: qty 10

## 2023-04-24 MED ORDER — FENTANYL CITRATE PF 50 MCG/ML IJ SOSY
50.0000 ug | PREFILLED_SYRINGE | Freq: Once | INTRAMUSCULAR | Status: DC
  Filled 2023-04-24: qty 1

## 2023-04-24 MED ORDER — ACETAMINOPHEN 500 MG PO TABS
1000.0000 mg | ORAL_TABLET | Freq: Once | ORAL | Status: AC
  Administered 2023-04-24: 1000 mg via ORAL
  Filled 2023-04-24: qty 2

## 2023-04-24 MED ORDER — ACETAMINOPHEN 325 MG PO TABS: 650.0000 mg | ORAL_TABLET | Freq: Once | ORAL | Status: DC

## 2023-04-24 MED ORDER — FENTANYL CITRATE PF 50 MCG/ML IJ SOSY: 50.0000 ug | PREFILLED_SYRINGE | Freq: Once | INTRAMUSCULAR | Status: DC | PRN

## 2023-04-24 MED ORDER — IOHEXOL 300 MG/ML  SOLN
100.0000 mL | Freq: Once | INTRAMUSCULAR | Status: AC | PRN
  Administered 2023-04-24: 100 mL via INTRAVENOUS

## 2023-04-24 MED ORDER — ONDANSETRON 8 MG PO TBDP: 8.0000 mg | ORAL_TABLET | Freq: Three times a day (TID) | ORAL | 0 refills | Status: DC | PRN

## 2023-04-24 NOTE — ED Provider Notes (Signed)
[facility] Provider Note: Lowella Dell, MD, FACEP  CSN: [id number] MRN: [id number] ARRIVAL: 04/24/23 at 0259 ROOM: DB013/DB013   CHIEF COMPLAINT  Abdominal Pain   HISTORY OF PRESENT ILLNESS  04/24/23 3:34 AM [name] is a 62 y.o. female who awakened this morning with epigastric and left upper quadrant pain.  This is associated with nausea, vomiting, diarrhea and lightheadedness. She rates the pain as a 9 out of 10.  She describes the pain as a dull ache with intermittent cramping.   Past Medical History:  Diagnosis Date   Allergy    SEASONAL   Chronic RUQ pain    Colon polyps    hyperplastic - some right sided   Fe deficiency anemia    Foot pain, left    Frequent headaches    GERD (gastroesophageal reflux disease)    Helicobacter pylori antibody positive    Hemorrhoids    Hyperlipidemia    NO MEDS   Hypertension    NO MEDS   IBS (irritable bowel syndrome)    Joint pain    Macular degeneration, left eye    Menopausal symptoms    Mild asthma    Obesity    Palpitations    Pneumonia    Shortness of breath    Small intestinal bacterial overgrowth 08/08/2019   Swelling of both lower extremities     Past Surgical History:  Procedure Laterality Date   ABDOMINAL HYSTERECTOMY  2005   APPENDECTOMY  1984   CERVICAL CANCER CELLS FROZEN  1991   COLONOSCOPY     2011 and 2018   DILATION AND CURETTAGE OF UTERUS  1991   ESOPHAGOGASTRODUODENOSCOPY  10/2017    Family History  Problem Relation Age of Onset   Congestive Heart Failure Mother    Diabetes Mother    High blood pressure Mother    Obesity Mother    High blood pressure Father    Diabetes Father    GER disease Sister    Other Sister        Leaky Gut   Breast cancer Maternal Aunt    Breast cancer Cousin    Stomach cancer Neg Hx    Colon cancer Neg Hx    Throat cancer Neg Hx    Pancreatic cancer Neg Hx    Colon polyps Neg Hx    Crohn's disease Neg Hx    Esophageal cancer Neg Hx     Rectal cancer Neg Hx    Ulcerative colitis Neg Hx     Social History   Tobacco Use   Smoking status: Never    Passive exposure: Never   Smokeless tobacco: Never  Vaping Use   Vaping Use: Never used  Substance Use Topics   Alcohol use: No   Drug use: No    Prior to Admission medications   Medication Sig Start Date End Date Taking? Authorizing Provider  dicyclomine (BENTYL) 20 MG tablet Take 1 tablet (20 mg total) by mouth every 6 (six) hours as needed (abdominal cramping). 04/24/23  Yes Trevonn Hallum, Jonny Ruiz, MD  ondansetron (ZOFRAN-ODT) 8 MG disintegrating tablet Take 1 tablet (8 mg total) by mouth every 8 (eight) hours as needed for nausea or vomiting. 04/24/23  Yes Laronn Devonshire, MD  albuterol (PROVENTIL HFA;VENTOLIN HFA) 108 (90 Base) MCG/ACT inhaler Inhale 2 puffs into the lungs every 6 (six) hours as needed for wheezing or shortness of breath.    [provider]  ALOE VERA JUICE PO Take by mouth as needed.  2 oz prn    [provider]  azelastine (ASTELIN) 0.1 % nasal spray Place 2 sprays into both nostrils 2 (two) times daily. 09/28/22   Ellamae Sia, DO  b complex vitamins capsule Take 1 capsule by mouth daily.    [provider]  cetirizine (ZYRTEC) 10 MG tablet Take 10 mg by mouth daily as needed.    [provider]  Cholecalciferol (VITAMIN D3) 125 MCG (5000 UT) CAPS Take 1 capsule by mouth daily.    [provider]  Digestive Enzymes (DIGESTIVE ENZYME PO) Take by mouth as needed.    [provider]  EPINEPHrine 0.3 mg/0.3 mL IJ SOAJ injection See admin instructions. 10/02/19   [provider]  fluticasone (FLONASE) 50 MCG/ACT nasal spray Place 2 sprays into both nostrils daily. 09/28/22   Ellamae Sia, DO  fluticasone-salmeterol (ADVAIR) 100-50 MCG/ACT AEPB Inhale 1 puff into the lungs 2 (two) times daily. Rinse mouth after each use. 02/08/23   Ellamae Sia, DO  MAGNESIUM PO Take 120 mg by mouth daily.    [provider]   pantoprazole (PROTONIX) 40 MG tablet Take 40 mg by mouth daily as needed. 10/16/21   [provider]  Spacer/Aero-Holding Chambers (AEROCHAMBER MV) inhaler Use as instructed 07/15/22   Ellamae Sia, DO  sucralfate (CARAFATE) 1 g tablet Take 1 g by mouth 4 (four) times daily as needed. 10/16/21   [provider]    Allergies Amoxicillin, Shellfish allergy, and Shellfish-derived products   REVIEW OF SYSTEMS  Negative except as noted here or in the History of Present Illness.   PHYSICAL EXAMINATION  Initial Vital Signs Blood pressure (!) 148/51, pulse 71, temperature 98.2 F (36.8 C), resp. rate 18, height 5\' 6"  (1.676 m), weight 99.8 kg, SpO2 99 %.  Examination General: Well-developed, well-nourished female in no acute distress; appearance consistent with age of record HENT: normocephalic; atraumatic Eyes: Normal appearance Neck: supple Heart: regular rate and rhythm Lungs: clear to auscultation bilaterally Abdomen: soft; nondistended; minimal epigastric tenderness; no masses or hepatosplenomegaly; bowel sounds present Extremities: No deformity; full range of motion; pulses normal Neurologic: Awake, alert and oriented; motor function intact in all extremities and symmetric; no facial droop Skin: Warm and dry Psychiatric: Flat affect   RESULTS  Summary of this visit's results, reviewed and interpreted by myself:   EKG Interpretation  Date/Time:    Ventricular Rate:    PR Interval:    QRS Duration:   QT Interval:    QTC Calculation:   R Axis:     Text Interpretation:         Laboratory Studies: Results for orders placed or performed during the hospital encounter of 04/24/23 (from the past 24 hour(s))  Lipase, blood     Status: None   Collection Time: 04/24/23  3:20 AM  Result Value Ref Range   Lipase 15 11 - 51 U/L  Comprehensive metabolic panel     Status: Abnormal   Collection Time: 04/24/23  3:20 AM  Result Value Ref Range   Sodium 140 135 -  145 mmol/L   Potassium 3.4 (L) 3.5 - 5.1 mmol/L   Chloride 105 98 - 111 mmol/L   CO2 26 22 - 32 mmol/L   Glucose, Bld 125 (H) 70 - 99 mg/dL   BUN 19 6 - 20 mg/dL   Creatinine, Ser 1.61 0.44 - 1.00 mg/dL   Calcium 9.1 8.9 - 09.6 mg/dL   Total Protein 7.6 6.5 - 8.1  g/dL   Albumin 4.2 3.5 - 5.0 g/dL   AST 39 15 - 41 U/L   ALT 19 0 - 44 U/L   Alkaline Phosphatase 65 38 - 126 U/L   Total Bilirubin 0.6 0.3 - 1.2 mg/dL   GFR, Estimated >16 >10 mL/min   Anion gap 9 5 - 15  CBC     Status: Abnormal   Collection Time: 04/24/23  3:20 AM  Result Value Ref Range   WBC 11.6 (H) 4.0 - 10.5 K/uL   RBC 4.35 3.87 - 5.11 MIL/uL   Hemoglobin 13.3 12.0 - 15.0 g/dL   HCT 96.0 45.4 - 09.8 %   MCV 93.3 80.0 - 100.0 fL   MCH 30.6 26.0 - 34.0 pg   MCHC 32.8 30.0 - 36.0 g/dL   RDW 11.9 14.7 - 82.9 %   Platelets 255 150 - 400 K/uL   nRBC 0.0 0.0 - 0.2 %  Differential     Status: Abnormal   Collection Time: 04/24/23  3:20 AM  Result Value Ref Range   Neutrophils Relative % 81 %   Neutro Abs 8.9 (H) 1.7 - 7.7 K/uL   Lymphocytes Relative 15 %   Lymphs Abs 1.7 0.7 - 4.0 K/uL   Monocytes Relative 4 %   Monocytes Absolute 0.5 0.1 - 1.0 K/uL   Eosinophils Relative 0 %   Eosinophils Absolute 0.1 0.0 - 0.5 K/uL   Basophils Relative 0 %   Basophils Absolute 0.0 0.0 - 0.1 K/uL   Immature Granulocytes 0 %   Abs Immature Granulocytes 0.04 0.00 - 0.07 K/uL   Imaging Studies: CT ABDOMEN PELVIS W CONTRAST  Result Date: 04/24/2023 CLINICAL DATA:  Epigastric pain.  Vomiting and diarrhea EXAM: CT ABDOMEN AND PELVIS WITH CONTRAST TECHNIQUE: Multidetector CT imaging of the abdomen and pelvis was performed using the standard protocol following bolus administration of intravenous contrast. RADIATION DOSE REDUCTION: This exam was performed according to the departmental dose-optimization program which includes automated exposure control, adjustment of the mA and/or kV according to patient size and/or use of iterative  reconstruction technique. CONTRAST:  OMNIPAQUE IOHEXOL 300 MG/ML  SOLN COMPARISON:  02/28/2018 FINDINGS: Lower chest:  No contributory findings. Hepatobiliary: No focal liver abnormality. Mild periportal edema.no evidence of biliary obstruction or stone. Pancreas: Unremarkable. Spleen: Unremarkable. Adrenals/Urinary Tract: Negative adrenals. No hydronephrosis or stone. Unremarkable bladder. Stomach/Bowel:  No obstruction. No bowel wall thickening. Vascular/Lymphatic: No acute vascular abnormality. Minimal mesenteric fat haziness and node thickening towards the left, question underlying enteritis given the history. Reproductive:Hysterectomy. Other: No ascites or pneumoperitoneum. Musculoskeletal: No acute abnormalities. IMPRESSION: Minimal mesenteric changes which could relate to enteritis given the history. No bowel obstruction or wall thickening. Electronically Signed   By: Tiburcio Pea M.D.   On: 04/24/2023 06:18    ED COURSE and MDM  Nursing notes, initial and subsequent vitals signs, including pulse oximetry, reviewed and interpreted by myself.  Vitals:   04/24/23 0312 04/24/23 0315 04/24/23 0317  BP: (!) 148/51 (!) 133/58   Pulse: 71 70   Resp: 18    Temp: 98.2 F (36.8 C)    SpO2: 99% 99%   Weight: 99.8 kg  99.8 kg  Height: 5\' 6"  (1.676 m)  5\' 6"  (1.676 m)   Medications  ondansetron (ZOFRAN) injection 4 mg (has no administration in time range)  acetaminophen (TYLENOL) tablet 1,000 mg (1,000 mg Oral Given 04/24/23 0403)  pantoprazole (PROTONIX) injection 40 mg (40 mg Intravenous Given 04/24/23 0408)  iohexol (OMNIPAQUE)  300 MG/ML solution 100 mL (100 mLs Intravenous Contrast Given 04/24/23 0547)   6:23 AM Clinical presentation is consistent with gastroenteritis, likely viral as norovirus has been prevalent in the community recently.  This would explain the sudden onset and the minimal changes seen on CT scan.  We will treat with Zofran and Bentyl.  Her pain and nausea are  well-controlled at this time.   PROCEDURES  Procedures   ED DIAGNOSES     ICD-10-CM   1. Gastroenteritis  K52.9          Sly Parlee, MD 04/24/23 [phone]

## 2023-04-24 NOTE — ED Triage Notes (Signed)
Pt POV from home reporting awaking to sharp upper abdominal pain along with multiple episodes of vomiting and diarrhea. Pt also reports feeling dizzy.

## 2023-04-24 NOTE — ED Notes (Signed)
RN reviewed discharge instructions with pt. Pt verbalized understanding and had no further questions. VSS upon discharge.  

## 2023-04-24 NOTE — ED Notes (Signed)
Patient transported to CT 

## 2023-06-15 NOTE — Progress Notes (Deleted)
Follow Up Note  RE: [name] MRN: [id number] DOB: 1961/09/27 Date of Office Visit: 06/16/2023  Referring provider: [name], MD Primary care provider: [name], MD  Chief Complaint: No chief complaint on file.  History of Present Illness: I had the pleasure of seeing [name] for a follow up visit at the [facility] on 06/15/2023. She is a 62 y.o. female, who is being followed for asthma, allergic rhinoconjunctivitis, food allergy, GERD and pruritus. Her previous allergy office visit was on 03/31/2023 with Dr. Selena [name]. Today is a regular follow up visit.  Moderate persistent asthma Past history - Coughing, wheezing for 5+ yrs. Triggers include weather change. Using albuterol on rare occasions. Covid-19 in 2023 and 2022. H/o GERD. 2023 spirometry showed moderate obstruction with 31% improvement in FEV1 post bronchodilator treatment. Clinically feeling unchanged. 2023 CXR normal.  Interim history - Symbicort not covered. Picked up Advair but didn't take it. Sympt[place] the beach and needed to take albuterol. No prednisone.  Today's spirometry showed moderate obstruction.  Daily controller medication(s): start fluticasone-salmeterol inhaler 1 puff twice a day and rinse mouth afterwards.  Use for 2 months and then follow up with me. May use albuterol rescue inhaler 2 puffs every 4 to 6 hours as needed for shortness of breath, chest tightness, coughing, and wheezing. Monitor frequency of use.  Get spirometry at next visit.   Seasonal and perennial allergic rhinoconjunctivitis Past history - Perennial rhinoconjunctivitis symptoms for 10 years.  Uses Zyrtec and Flonase with some benefit.  No prior ENT evaluation. 2023 skin prick testing showed: Positive to weed pollen. 2023 bloodwork positive to dust mites, grass, cockroach, trees, ragweed. Interim history - sinus congestion unchanged. Didn't see ENT.  Continue environmental control measures as  below. Use over the counter antihistamines such as Claritin (loratadine), Allegra (fexofenadine), or Xyzal (levocetirizine) daily as needed. May take twice a day during allergy flares. May switch antihistamines every few months. Use Flonase (fluticasone) nasal spray 1 spray per nostril twice a day as needed for nasal congestion.  Nasal saline spray (i.e., Simply Saline) or nasal saline lavage (i.e., NeilMed) is recommended as needed and prior to medicated nasal sprays. Consider allergy injections for long term control if above medications do not help the symptoms.  Consider ENT evaluation for turbinate hypertrophy - will hold off referral per patient request.   Anaphylactic reaction due to food, subsequent encounter Past history - Shellfish caused hives and trouble breathing in the past. Avoiding chocolate, broccoli and red meat due to worsening acid reflux. 2023 skin prick testing was negative to select foods but positive control was borderline. 2023 bloodwork was positive to shellfish and mollusks. Continue to avoid foods that are bothersome - shellfish, mollusks. For mild symptoms you can take over the counter antihistamines such as Benadryl and monitor symptoms closely. If symptoms worsen or if you have severe symptoms including breathing issues, throat closure, significant swelling, whole body hives, severe diarrhea and vomiting, lightheadedness then inject epinephrine and seek immediate medical care afterwards. Action plan in place.    Gastroesophageal reflux disease Asymptomatic with no meds. Continue lifestyle and dietary modifications.   Pruritus Past history - 2023 bloodwork positive ANA with elevated RNP antibody.  Interim history - saw rheum, just monitoring symptoms.  Continue proper skincare. Continue antihistamine as above.   Return in about 2 months (around 05/31/2023).  Assessment and Plan: [name] is a 62 y.o. female with: No problem-specific Assessment & Plan notes found  for this encounter.  No follow-ups on file.  No orders of the defined types were placed in this encounter.  Lab Orders  No laboratory test(s) ordered today    Diagnostics: Spirometry:  Tracings reviewed. Her effort: {Blank single:19197::"Good reproducible efforts.","It was hard to get consistent efforts and there is a question as to whether this reflects a maximal maneuver.","Poor effort, data can not be interpreted."} FVC: ***L FEV1: ***L, ***% predicted FEV1/FVC ratio: ***% Interpretation: {Blank single:19197::"Spirometry consistent with mild obstructive disease","Spirometry consistent with moderate obstructive disease","Spirometry consistent with severe obstructive disease","Spirometry consistent with possible restrictive disease","Spirometry consistent with mixed obstructive and restrictive disease","Spirometry uninterpretable due to technique","Spirometry consistent with normal pattern","No overt abnormalities noted given today's efforts"}.  Please see scanned spirometry results for details.  Skin Testing: {Blank single:19197::"Select foods","Environmental allergy panel","Environmental allergy panel and select foods","Food allergy panel","None","Deferred due to recent antihistamines use"}. *** Results discussed with patient/family.   Medication List:  Current Outpatient Medications  Medication Sig Dispense Refill   albuterol (PROVENTIL HFA;VENTOLIN HFA) 108 (90 Base) MCG/ACT inhaler Inhale 2 puffs into the lungs every 6 (six) hours as needed for wheezing or shortness of breath.     ALOE VERA JUICE PO Take by mouth as needed. 2 oz prn     azelastine (ASTELIN) 0.1 % nasal spray Place 2 sprays into both nostrils 2 (two) times daily. 30 mL 5   b complex vitamins capsule Take 1 capsule by mouth daily.     cetirizine (ZYRTEC) 10 MG tablet Take 10 mg by mouth daily as needed.     Cholecalciferol (VITAMIN D3) 125 MCG (5000 UT) CAPS Take 1 capsule by mouth daily.     dicyclomine (BENTYL)  20 MG tablet Take 1 tablet (20 mg total) by mouth every 6 (six) hours as needed (abdominal cramping). 20 tablet 0   Digestive Enzymes (DIGESTIVE ENZYME PO) Take by mouth as needed.     EPINEPHrine 0.3 mg/0.3 mL IJ SOAJ injection See admin instructions.     fluticasone (FLONASE) 50 MCG/ACT nasal spray Place 2 sprays into both nostrils daily. 16 g 5   fluticasone-salmeterol (ADVAIR) 100-50 MCG/ACT AEPB Inhale 1 puff into the lungs 2 (two) times daily. Rinse mouth after each use. 60 each 3   MAGNESIUM PO Take 120 mg by mouth daily.     ondansetron (ZOFRAN-ODT) 8 MG disintegrating tablet Take 1 tablet (8 mg total) by mouth every 8 (eight) hours as needed for nausea or vomiting. 10 tablet 0   pantoprazole (PROTONIX) 40 MG tablet Take 40 mg by mouth daily as needed.     Spacer/Aero-Holding Chambers (AEROCHAMBER MV) inhaler Use as instructed 1 each 2   sucralfate (CARAFATE) 1 g tablet Take 1 g by mouth 4 (four) times daily as needed.     No current facility-administered medications for this visit.   Allergies: Allergies  Allergen Reactions   Amoxicillin Hives, Swelling and Other (See Comments)   Shellfish Allergy Hives    Any seafood.   Shellfish-Derived Products     Other reaction(s): hives/swelling   I reviewed her past medical history, social history, family history, and environmental history and no significant changes have been reported from her previous visit.  Review of Systems  Constitutional:  Negative for appetite change, chills, fever and unexpected weight change.  HENT:  Positive for congestion. Negative for postnasal drip and rhinorrhea.   Respiratory:  Negative for cough, chest tightness, shortness of breath and wheezing.   Cardiovascular:  Negative for chest pain.  Gastrointestinal:  Negative  for abdominal pain.  Genitourinary:  Negative for difficulty urinating.  Skin:  Negative for rash.  Allergic/Immunologic: Positive for environmental allergies and food allergies.   Neurological:  Negative for headaches.    Objective: There were no vitals taken for this visit. There is no height or weight on file to calculate BMI. Physical Exam Vitals and nursing note reviewed.  Constitutional:      Appearance: Normal appearance. She is well-developed. She is obese.  HENT:     Head: Normocephalic and atraumatic.     Right Ear: Tympanic membrane and external ear normal.     Left Ear: Tympanic membrane and external ear normal.     Nose: Congestion (turbinate hypertrophy b/l) present.     Mouth/Throat:     Mouth: Mucous membranes are moist.     Pharynx: Oropharynx is clear.  Eyes:     Conjunctiva/sclera: Conjunctivae normal.  Cardiovascular:     Rate and Rhythm: Normal rate and regular rhythm.     Heart sounds: Normal heart sounds. No murmur heard.    No friction rub. No gallop.  Pulmonary:     Effort: Pulmonary effort is normal.     Breath sounds: Normal breath sounds. No wheezing, rhonchi or rales.  Musculoskeletal:     Cervical back: Neck supple.  Skin:    General: Skin is warm.     Findings: No rash.  Neurological:     Mental Status: She is alert and oriented to person, place, and time.  Psychiatric:        Behavior: Behavior normal.    Previous notes and tests were reviewed. The plan was reviewed with the patient/family, and all questions/concerned were addressed.  It was my pleasure to see [name] today and participate in her care. Please feel free to contact me with any questions or concerns.  Sincerely,  Wyline Mood, DO Allergy & Immunology  [facility] office: [phone] [facility] office: [phone]

## 2023-06-16 ENCOUNTER — Ambulatory Visit: Payer: No Typology Code available for payment source | Admitting: Allergy

## 2023-07-25 NOTE — Progress Notes (Deleted)
Follow Up Note  RE: [name] MRN: [id number] DOB: 07-23-1961 Date of Office Visit: 07/26/2023  Referring provider: [name], MD Primary care provider: [name], MD  Chief Complaint: No chief complaint on file.  History of Present Illness: I had the pleasure of seeing [name] for a follow up visit at the [facility] on 07/25/2023. She is a 62 y.o. female, who is being followed for asthma, allergic rhinoconjunctivitis, food allergy, GERD and pruritus. Her previous allergy office visit was on 03/31/2023 with Dr. Selena Batten. Today is a regular follow up visit.  Moderate persistent asthma Past history - Coughing, wheezing for 5+ yrs. Triggers include weather change. Using albuterol on rare occasions. Covid-19 in 2023 and 2022. H/o GERD. 2023 spirometry showed moderate obstruction with 31% improvement in FEV1 post bronchodilator treatment. Clinically feeling unchanged. 2023 CXR normal.  Interim history - Symbicort not covered. Picked up Advair but didn't take it. Symptoms flared at the beach and needed to take albuterol. No prednisone.  Today's spirometry showed moderate obstruction.  Daily controller medication(s): start fluticasone-salmeterol inhaler 1 puff twice a day and rinse mouth afterwards.  Use for 2 months and then follow up with me. May use albuterol rescue inhaler 2 puffs every 4 to 6 hours as needed for shortness of breath, chest tightness, coughing, and wheezing. Monitor frequency of use.  Get spirometry at next visit.   Seasonal and perennial allergic rhinoconjunctivitis Past history - Perennial rhinoconjunctivitis symptoms for 10 years.  Uses Zyrtec and Flonase with some benefit.  No prior ENT evaluation. 2023 skin prick testing showed: Positive to weed pollen. 2023 bloodwork positive to dust mites, grass, cockroach, trees, ragweed. Interim history - sinus congestion unchanged. Didn't see ENT.  Continue environmental control measures as  below. Use over the counter antihistamines such as Claritin (loratadine), Allegra (fexofenadine), or Xyzal (levocetirizine) daily as needed. May take twice a day during allergy flares. May switch antihistamines every few months. Use Flonase (fluticasone) nasal spray 1 spray per nostril twice a day as needed for nasal congestion.  Nasal saline spray (i.e., Simply Saline) or nasal saline lavage (i.e., NeilMed) is recommended as needed and prior to medicated nasal sprays. Consider allergy injections for long term control if above medications do not help the symptoms.  Consider ENT evaluation for turbinate hypertrophy - will hold off referral per patient request.   Anaphylactic reaction due to food, subsequent encounter Past history - Shellfish caused hives and trouble breathing in the past. Avoiding chocolate, broccoli and red meat due to worsening acid reflux. 2023 skin prick testing was negative to select foods but positive control was borderline. 2023 bloodwork was positive to shellfish and mollusks. Continue to avoid foods that are bothersome - shellfish, mollusks. For mild symptoms you can take over the counter antihistamines such as Benadryl and monitor symptoms closely. If symptoms worsen or if you have severe symptoms including breathing issues, throat closure, significant swelling, whole body hives, severe diarrhea and vomiting, lightheadedness then inject epinephrine and seek immediate medical care afterwards. Action plan in place.    Gastroesophageal reflux disease Asymptomatic with no meds. Continue lifestyle and dietary modifications.   Pruritus Past history - 2023 bloodwork positive ANA with elevated RNP antibody.  Interim history - saw rheum, just monitoring symptoms.  Continue proper skincare. Continue antihistamine as above.  Assessment and Plan: [name] is a 62 y.o. female with: Moderate persistent asthma without complication Past history - Triggers include weather change.  Covid-19 in 2023  and 2022. H/o GERD. 2023 spirometry showed moderate obstruction with 31% improvement in FEV1 post bronchodilator treatment. Clinically feeling unchanged. 2023 CXR normal.   Seasonal allergic rhinitis due to pollen Allergic rhinitis due to dust mite Allergy to cockroaches Allergic conjunctivitis of both eyes Past history - Perennial rhinoconjunctivitis symptoms for 10 years.  Uses Zyrtec and Flonase with some benefit.  No prior ENT evaluation. 2023 skin prick testing showed: Positive to weed pollen. 2023 bloodwork positive to dust mites, grass, cockroach, trees, ragweed.  GERD ***  Pruritus Past history - 2023 bloodwork positive ANA with elevated RNP antibody.    No follow-ups on file.  No orders of the defined types were placed in this encounter.  Lab Orders  No laboratory test(s) ordered today    Diagnostics: Spirometry:  Tracings reviewed. Her effort: {Blank single:19197::"Good reproducible efforts.","It was hard to get consistent efforts and there is a question as to whether this reflects a maximal maneuver.","Poor effort, data can not be interpreted."} FVC: ***L FEV1: ***L, ***% predicted FEV1/FVC ratio: ***% Interpretation: {Blank single:19197::"Spirometry consistent with mild obstructive disease","Spirometry consistent with moderate obstructive disease","Spirometry consistent with severe obstructive disease","Spirometry consistent with possible restrictive disease","Spirometry consistent with mixed obstructive and restrictive disease","Spirometry uninterpretable due to technique","Spirometry consistent with normal pattern","No overt abnormalities noted given today's efforts"}.  Please see scanned spirometry results for details.  Skin Testing: {Blank single:19197::"Select foods","Environmental allergy panel","Environmental allergy panel and select foods","Food allergy panel","None","Deferred due to recent antihistamines use"}. *** Results discussed with  patient/family.   Medication List:  Current Outpatient Medications  Medication Sig Dispense Refill  . albuterol (PROVENTIL HFA;VENTOLIN HFA) 108 (90 Base) MCG/ACT inhaler Inhale 2 puffs into the lungs every 6 (six) hours as needed for wheezing or shortness of breath.    . ALOE VERA JUICE PO Take by mouth as needed. 2 oz prn    . azelastine (ASTELIN) 0.1 % nasal spray Place 2 sprays into both nostrils 2 (two) times daily. 30 mL 5  . b complex vitamins capsule Take 1 capsule by mouth daily.    . cetirizine (ZYRTEC) 10 MG tablet Take 10 mg by mouth daily as needed.    . Cholecalciferol (VITAMIN D3) 125 MCG (5000 UT) CAPS Take 1 capsule by mouth daily.    Marland Kitchen dicyclomine (BENTYL) 20 MG tablet Take 1 tablet (20 mg total) by mouth every 6 (six) hours as needed (abdominal cramping). 20 tablet 0  . Digestive Enzymes (DIGESTIVE ENZYME PO) Take by mouth as needed.    Marland Kitchen EPINEPHrine 0.3 mg/0.3 mL IJ SOAJ injection See admin instructions.    . fluticasone (FLONASE) 50 MCG/ACT nasal spray Place 2 sprays into both nostrils daily. 16 g 5  . fluticasone-salmeterol (ADVAIR) 100-50 MCG/ACT AEPB Inhale 1 puff into the lungs 2 (two) times daily. Rinse mouth after each use. 60 each 3  . MAGNESIUM PO Take 120 mg by mouth daily.    . ondansetron (ZOFRAN-ODT) 8 MG disintegrating tablet Take 1 tablet (8 mg total) by mouth every 8 (eight) hours as needed for nausea or vomiting. 10 tablet 0  . pantoprazole (PROTONIX) 40 MG tablet Take 40 mg by mouth daily as needed.    Marland Kitchen Spacer/Aero-Holding Chambers (AEROCHAMBER MV) inhaler Use as instructed 1 each 2  . sucralfate (CARAFATE) 1 g tablet Take 1 g by mouth 4 (four) times daily as needed.     No current facility-administered medications for this visit.   Allergies: Allergies  Allergen Reactions  . Amoxicillin Hives, Swelling and Other (See Comments)  .  Shellfish Allergy Hives    Any seafood.  Marland Kitchen Shellfish-Derived Products     Other reaction(s): hives/swelling   I  reviewed her past medical history, social history, family history, and environmental history and no significant changes have been reported from her previous visit.  Review of Systems  Constitutional:  Negative for appetite change, chills, fever and unexpected weight change.  HENT:  Positive for congestion. Negative for postnasal drip and rhinorrhea.   Respiratory:  Negative for cough, chest tightness, shortness of breath and wheezing.   Cardiovascular:  Negative for chest pain.  Gastrointestinal:  Negative for abdominal pain.  Genitourinary:  Negative for difficulty urinating.  Skin:  Negative for rash.  Allergic/Immunologic: Positive for environmental allergies and food allergies.  Neurological:  Negative for headaches.   Objective: There were no vitals taken for this visit. There is no height or weight on file to calculate BMI. Physical Exam Vitals and nursing note reviewed.  Constitutional:      Appearance: Normal appearance. She is well-developed. She is obese.  HENT:     Head: Normocephalic and atraumatic.     Right Ear: Tympanic membrane and external ear normal.     Left Ear: Tympanic membrane and external ear normal.     Nose: Congestion (turbinate hypertrophy b/l) present.     Mouth/Throat:     Mouth: Mucous membranes are moist.     Pharynx: Oropharynx is clear.  Eyes:     Conjunctiva/sclera: Conjunctivae normal.  Cardiovascular:     Rate and Rhythm: Normal rate and regular rhythm.     Heart sounds: Normal heart sounds. No murmur heard.    No friction rub. No gallop.  Pulmonary:     Effort: Pulmonary effort is normal.     Breath sounds: Normal breath sounds. No wheezing, rhonchi or rales.  Musculoskeletal:     Cervical back: Neck supple.  Skin:    General: Skin is warm.     Findings: No rash.  Neurological:     Mental Status: She is alert and oriented to person, place, and time.  Psychiatric:        Behavior: Behavior normal.  Previous notes and tests were  reviewed. The plan was reviewed with the patient/family, and all questions/concerned were addressed.  It was my pleasure to see [name] today and participate in her care. Please feel free to contact me with any questions or concerns.  Sincerely,  Wyline Mood, DO Allergy & Immunology  [facility] office: [phone] [facility] office: [phone]

## 2023-07-26 ENCOUNTER — Ambulatory Visit: Payer: No Typology Code available for payment source | Admitting: Allergy

## 2023-07-26 DIAGNOSIS — J3089 Other allergic rhinitis: Secondary | ICD-10-CM

## 2023-07-26 DIAGNOSIS — J4540 Moderate persistent asthma, uncomplicated: Secondary | ICD-10-CM

## 2023-07-26 DIAGNOSIS — H1013 Acute atopic conjunctivitis, bilateral: Secondary | ICD-10-CM

## 2023-07-26 DIAGNOSIS — J301 Allergic rhinitis due to pollen: Secondary | ICD-10-CM

## 2023-07-26 DIAGNOSIS — K219 Gastro-esophageal reflux disease without esophagitis: Secondary | ICD-10-CM

## 2023-07-26 DIAGNOSIS — L299 Pruritus, unspecified: Secondary | ICD-10-CM

## 2023-07-26 DIAGNOSIS — J309 Allergic rhinitis, unspecified: Secondary | ICD-10-CM

## 2023-07-26 DIAGNOSIS — Z91038 Other insect allergy status: Secondary | ICD-10-CM

## 2024-01-06 ENCOUNTER — Other Ambulatory Visit: Payer: Self-pay | Admitting: Obstetrics and Gynecology

## 2024-01-06 DIAGNOSIS — Z1231 Encounter for screening mammogram for malignant neoplasm of breast: Secondary | ICD-10-CM

## 2024-01-14 ENCOUNTER — Ambulatory Visit
Admission: RE | Admit: 2024-01-14 | Discharge: 2024-01-14 | Disposition: A | Discharge status: Home / Self Care | Payer: No Typology Code available for payment source | Source: Ambulatory Visit | Attending: Obstetrics and Gynecology | Admitting: Obstetrics and Gynecology

## 2024-01-14 DIAGNOSIS — Z1231 Encounter for screening mammogram for malignant neoplasm of breast: Secondary | ICD-10-CM

## 2024-02-28 NOTE — Progress Notes (Deleted)
 Office Visit Note  Patient: [name]             Date of Birth: 09/14/1961           MRN: [id number]             PCP: [name], MD Referring: [name], MD Visit Date: 03/13/2024 Occupation: @GUAROCC @  Subjective:  No chief complaint on file.   History of Present Illness: [name] is a 63 y.o. female ***     Activities of Daily Living:  Patient reports morning stiffness for *** {minute/hour:19697}.   Patient {ACTIONS;DENIES/REPORTS:21021675::"Denies"} nocturnal pain.  Difficulty dressing/grooming: {ACTIONS;DENIES/REPORTS:21021675::"Denies"} Difficulty climbing stairs: {ACTIONS;DENIES/REPORTS:21021675::"Denies"} Difficulty getting out of chair: {ACTIONS;DENIES/REPORTS:21021675::"Denies"} Difficulty using hands for taps, buttons, cutlery, and/or writing: {ACTIONS;DENIES/REPORTS:21021675::"Denies"}  No Rheumatology ROS completed.   PMFS History:  Patient Active Problem List   Diagnosis Date Noted   Seasonal and perennial allergic rhinoconjunctivitis 09/28/2022   Anaphylactic reaction due to food, subsequent encounter 09/28/2022   Anxiety 07/06/2022   Other allergy status, other than to drugs and biological substances 07/06/2022   Paresthesia of skin 07/06/2022   Pure hypercholesterolemia 07/06/2022   Shellfish allergy 07/06/2022   Ventricular premature depolarization 07/06/2022   Urticaria 07/06/2022   Moderate persistent asthma 07/06/2022   Pruritus 07/06/2022   Gastroesophageal reflux disease 01/31/2021   Chronic RUQ pain 09/17/2020   Small intestinal bacterial overgrowth 08/08/2019   Excessive daytime sleepiness 02/06/2019   Loud snoring 02/06/2019   Class 2 severe obesity with serious comorbidity and body mass index (BMI) of 38.0 to 38.9 in adult (HCC) 01/10/2019   Vitamin D deficiency 01/10/2019   Heart palpitations 11/11/2016   Chest pain 11/11/2016   Leg pain 11/11/2016   Essential hypertension 03/03/2014    Past Medical History:   Diagnosis Date   Allergy    SEASONAL   Chronic RUQ pain    Colon polyps    hyperplastic - some right sided   Fe deficiency anemia    Foot pain, left    Frequent headaches    GERD (gastroesophageal reflux disease)    Helicobacter pylori antibody positive    Hemorrhoids    Hyperlipidemia    NO MEDS   Hypertension    NO MEDS   IBS (irritable bowel syndrome)    Joint pain    Macular degeneration, left eye    Menopausal symptoms    Mild asthma    Obesity    Palpitations    Pneumonia    Shortness of breath    Small intestinal bacterial overgrowth 08/08/2019   Swelling of both lower extremities     Family History  Problem Relation Age of Onset   Congestive Heart Failure Mother    Diabetes Mother    High blood pressure Mother    Obesity Mother    High blood pressure Father    Diabetes Father    GER disease Sister    Other Sister        Leaky Gut   Breast cancer Maternal Aunt    Breast cancer Cousin    Stomach cancer Neg Hx    Colon cancer Neg Hx    Throat cancer Neg Hx    Pancreatic cancer Neg Hx    Colon polyps Neg Hx    Crohn's disease Neg Hx    Esophageal cancer Neg Hx    Rectal cancer Neg Hx    Ulcerative colitis Neg Hx    Past Surgical History:  Procedure Laterality Date  ABDOMINAL HYSTERECTOMY  2005   APPENDECTOMY  1984   CERVICAL CANCER CELLS FROZEN  1991   COLONOSCOPY     2011 and 2018   DILATION AND CURETTAGE OF UTERUS  1991   ESOPHAGOGASTRODUODENOSCOPY  10/2017   Social History   Social History Narrative   Married, 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   Physical Exam   Musculoskeletal Exam: ***  CDAI Exam: CDAI Score: -- Patient Global: --; Provider Global: -- Swollen: --;  Tender: -- Joint Exam 03/13/2024   No joint exam has been documented for this visit   There is currently no information documented on the homunculus. Go to the Rheumatology activity and complete the homunculus joint exam.  Investigation: No additional findings.  Imaging: No results found.  Recent Labs: Lab Results  Component Value Date   WBC 11.6 (H) 04/24/2023   HGB 13.3 04/24/2023   PLT 255 04/24/2023   NA 140 04/24/2023   K 3.4 (L) 04/24/2023   CL 105 04/24/2023   CO2 26 04/24/2023   GLUCOSE 125 (H) 04/24/2023   BUN 19 04/24/2023   CREATININE 0.78 04/24/2023   BILITOT 0.6 04/24/2023   ALKPHOS 65 04/24/2023   AST 39 04/24/2023   ALT 19 04/24/2023   PROT 7.6 04/24/2023   ALBUMIN 4.2 04/24/2023   CALCIUM 9.1 04/24/2023   GFRAA 97 12/26/2018    Speciality Comments: No specialty comments available.  Procedures:  No procedures performed Allergies: Amoxicillin, Shellfish allergy, and Shellfish-derived products   Assessment / Plan:     Visit Diagnoses: No diagnosis found.  Orders: No orders of the defined types were placed in this encounter.  No orders of the defined types were placed in this encounter.   Face-to-face time spent with patient was *** minutes. Greater than 50% of time was spent in counseling and coordination of care.  Follow-Up Instructions: No follow-ups on file.   Ellen Henri, CMA  Note - This record has been created using Animal nutritionist.  Chart creation errors have been sought, but may not always  have been located. Such creation errors do not reflect on  the standard of medical care.

## 2024-03-13 ENCOUNTER — Ambulatory Visit: Payer: No Typology Code available for payment source | Admitting: Rheumatology

## 2024-03-13 DIAGNOSIS — H1010 Acute atopic conjunctivitis, unspecified eye: Secondary | ICD-10-CM

## 2024-03-13 DIAGNOSIS — I10 Essential (primary) hypertension: Secondary | ICD-10-CM

## 2024-03-13 DIAGNOSIS — F419 Anxiety disorder, unspecified: Secondary | ICD-10-CM

## 2024-03-13 DIAGNOSIS — M25531 Pain in right wrist: Secondary | ICD-10-CM

## 2024-03-13 DIAGNOSIS — K638219 Small intestinal bacterial overgrowth, unspecified: Secondary | ICD-10-CM

## 2024-03-13 DIAGNOSIS — E7800 Pure hypercholesterolemia, unspecified: Secondary | ICD-10-CM

## 2024-03-13 DIAGNOSIS — J4540 Moderate persistent asthma, uncomplicated: Secondary | ICD-10-CM

## 2024-03-13 DIAGNOSIS — M7662 Achilles tendinitis, left leg: Secondary | ICD-10-CM

## 2024-03-13 DIAGNOSIS — R202 Paresthesia of skin: Secondary | ICD-10-CM

## 2024-03-13 DIAGNOSIS — L299 Pruritus, unspecified: Secondary | ICD-10-CM

## 2024-03-13 DIAGNOSIS — Z91013 Allergy to seafood: Secondary | ICD-10-CM

## 2024-03-13 DIAGNOSIS — Z8719 Personal history of other diseases of the digestive system: Secondary | ICD-10-CM

## 2024-03-13 DIAGNOSIS — M47816 Spondylosis without myelopathy or radiculopathy, lumbar region: Secondary | ICD-10-CM

## 2024-03-13 DIAGNOSIS — I493 Ventricular premature depolarization: Secondary | ICD-10-CM

## 2024-03-13 DIAGNOSIS — R768 Other specified abnormal immunological findings in serum: Secondary | ICD-10-CM

## 2024-06-19 ENCOUNTER — Encounter: Payer: Self-pay | Admitting: Physician Assistant

## 2024-06-19 ENCOUNTER — Ambulatory Visit: Attending: Physician Assistant | Admitting: Physician Assistant

## 2024-06-19 VITALS — BP 126/85 | HR 66 | Resp 15 | Ht 66.0 in | Wt 240.0 lb

## 2024-06-19 DIAGNOSIS — J3089 Other allergic rhinitis: Secondary | ICD-10-CM

## 2024-06-19 DIAGNOSIS — M25531 Pain in right wrist: Secondary | ICD-10-CM | POA: Diagnosis not present

## 2024-06-19 DIAGNOSIS — M51369 Other intervertebral disc degeneration, lumbar region without mention of lumbar back pain or lower extremity pain: Secondary | ICD-10-CM | POA: Diagnosis not present

## 2024-06-19 DIAGNOSIS — J4540 Moderate persistent asthma, uncomplicated: Secondary | ICD-10-CM

## 2024-06-19 DIAGNOSIS — R768 Other specified abnormal immunological findings in serum: Secondary | ICD-10-CM

## 2024-06-19 DIAGNOSIS — Z8719 Personal history of other diseases of the digestive system: Secondary | ICD-10-CM

## 2024-06-19 DIAGNOSIS — J302 Other seasonal allergic rhinitis: Secondary | ICD-10-CM

## 2024-06-19 DIAGNOSIS — F419 Anxiety disorder, unspecified: Secondary | ICD-10-CM

## 2024-06-19 DIAGNOSIS — H1010 Acute atopic conjunctivitis, unspecified eye: Secondary | ICD-10-CM

## 2024-06-19 DIAGNOSIS — E7800 Pure hypercholesterolemia, unspecified: Secondary | ICD-10-CM

## 2024-06-19 DIAGNOSIS — M7662 Achilles tendinitis, left leg: Secondary | ICD-10-CM | POA: Diagnosis not present

## 2024-06-19 DIAGNOSIS — I10 Essential (primary) hypertension: Secondary | ICD-10-CM

## 2024-06-19 DIAGNOSIS — I493 Ventricular premature depolarization: Secondary | ICD-10-CM

## 2024-06-19 DIAGNOSIS — K638219 Small intestinal bacterial overgrowth, unspecified: Secondary | ICD-10-CM

## 2024-06-19 NOTE — Progress Notes (Signed)
 Office Visit Note  Patient: [name]             Date of Birth: [birth year]           MRN: [id number]             PCP: Regino Slater, MD Referring: Regino Slater, MD Visit Date: 06/19/2024 Occupation: @GUAROCC @  Subjective:  Order updated lab work  History of Present Illness: [name] is a 63 y.o. female with history of positive and positive RNP antibody.  Patient was last seen in the office on 03/03/2023.  She denies any new or worsening symptoms since her last office visit.  She has not had any symptoms of Raynaud's phenomenon, recent rashes, or photosensitivity.  She denies any signs of alopecia.  She denies any oral or nasal ulcerations.  She has chronic dry eyes but has not had any mouth dryness.  She continues to have ongoing discomfort in her lower back.  She is under the care of Dr. Burnetta.  She tried physical therapy but had no improvement in her symptoms.  She is planning to follow back up with Dr. Burnetta and may consider proceeding with an MRI for further evaluation.  She is apprehensive to proceed with an injection until she had tried physical therapy.  She denies any other joint pain or joint swelling at this time. She has occasional stiffness in both hands.    Activities of Daily Living:  Patient reports morning stiffness for 10 minutes.   Patient Denies nocturnal pain.  Difficulty dressing/grooming: Denies Difficulty climbing stairs: Denies Difficulty getting out of chair: Denies Difficulty using hands for taps, buttons, cutlery, and/or writing: Denies  Review of Systems  Constitutional:  Negative for fatigue.  HENT:  Negative for mouth sores and mouth dryness.   Eyes:  Positive for dryness.  Respiratory:  Negative for shortness of breath.   Cardiovascular:  Negative for chest pain and palpitations.  Gastrointestinal:  Positive for blood in stool and constipation. Negative for diarrhea.  Endocrine: Negative for increased urination.  Genitourinary:   Negative for involuntary urination.  Musculoskeletal:  Positive for joint pain, joint pain, joint swelling, morning stiffness and muscle tenderness. Negative for gait problem, myalgias, muscle weakness and myalgias.  Skin:  Negative for color change, rash, hair loss and sensitivity to sunlight.  Allergic/Immunologic: Negative for susceptible to infections.  Neurological:  Negative for dizziness and headaches.  Hematological:  Negative for swollen glands.  Psychiatric/Behavioral:  Positive for sleep disturbance. Negative for depressed mood. The patient is not nervous/anxious.     PMFS History:  Patient Active Problem List   Diagnosis Date Noted   Seasonal and perennial allergic rhinoconjunctivitis 09/28/2022   Anaphylactic reaction due to food, subsequent encounter 09/28/2022   Anxiety 07/06/2022   Other allergy  status, other than to drugs and biological substances 07/06/2022   Paresthesia of skin 07/06/2022   Pure hypercholesterolemia 07/06/2022   Shellfish allergy  07/06/2022   Ventricular premature depolarization 07/06/2022   Urticaria 07/06/2022   Moderate persistent asthma 07/06/2022   Pruritus 07/06/2022   Gastroesophageal reflux disease 01/31/2021   Chronic RUQ pain 09/17/2020   Small intestinal bacterial overgrowth 08/08/2019   Excessive daytime sleepiness 02/06/2019   Loud snoring 02/06/2019   Class 2 severe obesity with serious comorbidity and body mass index (BMI) of 38.0 to 38.9 in adult [facility]) 01/10/2019   Vitamin D  deficiency 01/10/2019   Heart palpitations 11/11/2016   Chest pain 11/11/2016   Leg pain 11/11/2016   Essential  hypertension 03/03/2014    Past Medical History:  Diagnosis Date   Allergy     SEASONAL   Chronic RUQ pain    Colon polyps    hyperplastic - some right sided   Fe deficiency anemia    Foot pain, left    Frequent headaches    GERD (gastroesophageal reflux disease)    Helicobacter pylori antibody positive    Hemorrhoids    Hyperlipidemia     NO MEDS   Hypertension    NO MEDS   IBS (irritable bowel syndrome)    Joint pain    Macular degeneration, left eye    Menopausal symptoms    Mild asthma    Obesity    Palpitations    Pneumonia    Shortness of breath    Small intestinal bacterial overgrowth 08/08/2019   Swelling of both lower extremities     Family History  Problem Relation Age of Onset   Congestive Heart Failure Mother    Diabetes Mother    High blood pressure Mother    Obesity Mother    High blood pressure Father    Diabetes Father    GER disease Sister    Other Sister        Leaky Gut   Breast cancer Maternal Aunt    Breast cancer Cousin    Stomach cancer Neg Hx    Colon cancer Neg Hx    Throat cancer Neg Hx    Pancreatic cancer Neg Hx    Colon polyps Neg Hx    Crohn's disease Neg Hx    Esophageal cancer Neg Hx    Rectal cancer Neg Hx    Ulcerative colitis Neg Hx    Past Surgical History:  Procedure Laterality Date   ABDOMINAL HYSTERECTOMY  2005   APPENDECTOMY  1984   CERVICAL CANCER CELLS FROZEN  1991   COLONOSCOPY     2011 and 2018   DILATION AND CURETTAGE OF UTERUS  1991   ESOPHAGOGASTRODUODENOSCOPY  10/2017   Social History   Social History Narrative   Married, 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  Constitutional:      Appearance: She is well-developed.  HENT:     Head: Normocephalic and atraumatic.  Eyes:     Conjunctiva/sclera: Conjunctivae normal.  Cardiovascular:     Rate and Rhythm: Normal rate and regular rhythm.     Heart sounds:  Normal heart sounds.  Pulmonary:     Effort: Pulmonary effort is normal.     Breath sounds: Normal breath sounds.  Abdominal:     General: Bowel sounds are normal.     Palpations: Abdomen is soft.  Musculoskeletal:     Cervical back: Normal range of motion.  Lymphadenopathy:     Cervical: No cervical adenopathy.  Skin:    General: Skin is warm and dry.     Capillary Refill: Capillary refill takes less than 2 seconds.  Neurological:     Mental Status: She is alert and oriented  to person, place, and time.  Psychiatric:        Behavior: Behavior normal.      Musculoskeletal Exam: C-spine has good range of motion.  Shoulder joints, elbow joints, wrist joints, MCPs, PIPs, DIPs have good range of motion with no synovitis.  Complete fist formation bilaterally.  Hip joints have good range of motion with no groin pain.  Mild tenderness over the trochanteric bursa bilaterally.  Knee joints have good range of motion no warmth or effusion.  Ankle joints have good range of motion with no tenderness or joint swelling.  No tenderness or synovitis over MTP joints.  CDAI Exam: CDAI Score: -- Patient Global: --; Provider Global: -- Swollen: --; Tender: -- Joint Exam 06/19/2024   No joint exam has been documented for this visit   There is currently no information documented on the homunculus. Go to the Rheumatology activity and complete the homunculus joint exam.  Investigation: No additional findings.  Imaging: No results found.  Recent Labs: Lab Results  Component Value Date   WBC 11.6 (H) 04/24/2023   HGB 13.3 04/24/2023   PLT 255 04/24/2023   NA 140 04/24/2023   K 3.4 (L) 04/24/2023   CL 105 04/24/2023   CO2 26 04/24/2023   GLUCOSE 125 (H) 04/24/2023   BUN 19 04/24/2023   CREATININE 0.78 04/24/2023   BILITOT 0.6 04/24/2023   ALKPHOS 65 04/24/2023   AST 39 04/24/2023   ALT 19 04/24/2023   PROT 7.6 04/24/2023   ALBUMIN 4.2 04/24/2023   CALCIUM 9.1 04/24/2023   GFRAA 97  12/26/2018    Speciality Comments: No specialty comments available.  Procedures:  No procedures performed Allergies: Amoxicillin, Shellfish allergy , and Shellfish-derived products   Assessment / Plan:     Visit Diagnoses: Positive ANA (antinuclear antibody) - Repeat ANA negative, RNP> 8.0, no clinical features of autoimmune disease: She has not noticed any new or worsening symptoms since her last office visit in April 2024.  She has not had any symptoms of Raynaud's phenomenon, recent rashes, oral or nasal ulcerations, sicca symptoms, or signs of alopecia.  No signs of sclerodactyly noted.  No digital ulcerations noted.   No synovitis was noted on examination today.  No new or worsening pulmonary symptoms.   Reviewed lab work from 02/02/2023.  Discussed signs and symptoms to monitor for closely. Plan to obtain the following lab work today for further evaluation.  She will follow up in 12 months or sooner if needed.  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  No tenderness or synovitis noted on examination today.  Achilles tendinitis, left leg - History of recurrent Achilles tendinitis.  Followed by a podiatrist.  Previous XR osteoarthritic changes and calcaneal spurs.  Improved after PT.  Not currently symptomatic.  Degeneration of intervertebral disc of lumbar region without discogenic back pain or lower extremity pain - History of lower back pain.  X-rays showed anterior osteophytes and facet joint arthropathy.  SI joints were unremarkable. Patient continues to have ongoing discomfort in her lower back.  She has established care with Dr. Burnetta and completed  physical therapy.  She had minimal to no improvement in her symptoms after completing PT and is planning to likely proceed with a MRI for further  evaluation.    Other medical conditions are listed as follows:   Moderate persistent asthma without complication  Seasonal and perennial allergic rhinoconjunctivitis  Essential hypertension: BP was 126/85 today in the office.   Ventricular premature depolarization  Pure hypercholesterolemia  Small intestinal bacterial overgrowth  History of gastroesophageal reflux (GERD)  Anxiety  Orders: Orders Placed This Encounter  Procedures   ANA   RNP Antibody   Anti-DNA antibody, double-stranded   C3 and C4   Sedimentation rate   Urinalysis, Routine w reflex microscopic   Comprehensive metabolic panel with GFR   CBC with Differential/Platelet   Anti-Smith antibody   Sjogrens syndrome-A extractable nuclear antibody   Sjogrens syndrome-B extractable nuclear antibody   No orders of the defined types were placed in this encounter.    Follow-Up Instructions: Return in about 1 year (around 06/19/2025) for +RNP.   Waddell CHRISTELLA Craze, PA-C  Note - This record has been created using Dragon software.  Chart creation errors have been sought, but may not always  have been located. Such creation errors do not reflect on  the standard of medical care.

## 2024-06-20 ENCOUNTER — Ambulatory Visit: Payer: Self-pay | Admitting: Physician Assistant

## 2024-06-20 NOTE — Progress Notes (Signed)
Complements WNL

## 2024-06-20 NOTE — Progress Notes (Signed)
 Glucose is 108, rest of CMP WNL CBC WNL ESR WNL UA normal

## 2024-06-21 LAB — COMPREHENSIVE METABOLIC PANEL WITH GFR
AG Ratio: 1.3 (calc) (ref 1.0–2.5)
ALT: 9 U/L (ref 6–29)
AST: 10 U/L (ref 10–35)
Albumin: 4 g/dL (ref 3.6–5.1)
Alkaline phosphatase (APISO): 53 U/L (ref 37–153)
BUN: 15 mg/dL (ref 7–25)
CO2: 29 mmol/L (ref 20–32)
Calcium: 8.9 mg/dL (ref 8.6–10.4)
Chloride: 107 mmol/L (ref 98–110)
Creat: 0.73 mg/dL (ref 0.50–1.05)
Globulin: 3 g/dL (ref 1.9–3.7)
Glucose, Bld: 108 mg/dL — ABNORMAL HIGH (ref 65–99)
Potassium: 3.9 mmol/L (ref 3.5–5.3)
Sodium: 144 mmol/L (ref 135–146)
Total Bilirubin: 0.6 mg/dL (ref 0.2–1.2)
Total Protein: 7 g/dL (ref 6.1–8.1)
eGFR: 94 mL/min/1.73m2 (ref >=60)

## 2024-06-21 LAB — C3 AND C4
C3 Complement: 152 mg/dL (ref 83–193)
C4 Complement: 25 mg/dL (ref 15–57)

## 2024-06-21 LAB — SJOGRENS SYNDROME-B EXTRACTABLE NUCLEAR ANTIBODY: SSB (La) (ENA) Antibody, IgG: <1 AI

## 2024-06-21 LAB — ANTI-DNA ANTIBODY, DOUBLE-STRANDED: ds DNA Ab: 1 [IU]/mL

## 2024-06-21 LAB — CBC WITH DIFFERENTIAL/PLATELET
Absolute Lymphocytes: 1632 {cells}/uL (ref 850–3900)
Absolute Monocytes: 260 {cells}/uL (ref 200–950)
Basophils Absolute: 20 {cells}/uL (ref 0–200)
Basophils Relative: 0.4 %
Eosinophils Absolute: 71 {cells}/uL (ref 15–500)
Eosinophils Relative: 1.4 %
HCT: 40.2 % (ref 35.0–45.0)
Hemoglobin: 13.1 g/dL (ref 11.7–15.5)
MCH: 30.4 pg (ref 27.0–33.0)
MCHC: 32.6 g/dL (ref 32.0–36.0)
MCV: 93.3 fL (ref 80.0–100.0)
MPV: 10 fL (ref 7.5–12.5)
Monocytes Relative: 5.1 %
Neutro Abs: 3116 {cells}/uL (ref 1500–7800)
Neutrophils Relative %: 61.1 %
Platelets: 256 Thousand/uL (ref 140–400)
RBC: 4.31 Million/uL (ref 3.80–5.10)
RDW: 12.6 % (ref 11.0–15.0)
Total Lymphocyte: 32 %
WBC: 5.1 Thousand/uL (ref 3.8–10.8)

## 2024-06-21 LAB — URINALYSIS, ROUTINE W REFLEX MICROSCOPIC
Bilirubin Urine: NEGATIVE
Glucose, UA: NEGATIVE
Hgb urine dipstick: NEGATIVE
Ketones, ur: NEGATIVE
Leukocytes,Ua: NEGATIVE
Nitrite: NEGATIVE
Protein, ur: NEGATIVE
Specific Gravity, Urine: 1.025 (ref 1.001–1.035)
pH: 5.5 (ref 5.0–8.0)

## 2024-06-21 LAB — SEDIMENTATION RATE: Sed Rate: 17 mm/h (ref 0–30)

## 2024-06-21 LAB — ANTI-SMITH ANTIBODY: ENA SM Ab Ser-aCnc: <1 AI

## 2024-06-21 LAB — SJOGRENS SYNDROME-A EXTRACTABLE NUCLEAR ANTIBODY: SSA (Ro) (ENA) Antibody, IgG: <1 AI

## 2024-06-21 LAB — RNP ANTIBODY: Ribonucleic Protein(ENA) Antibody, IgG: >8 AI — AB

## 2024-06-21 LAB — ANA: Anti Nuclear Antibody (ANA): NEGATIVE

## 2024-06-21 NOTE — Progress Notes (Signed)
 ANA is negative.

## 2024-06-22 NOTE — Progress Notes (Signed)
 RNP remains positive.  Smith antibody negative.  [place] antibodies negative.  dsDNA is negative.

## 2024-06-25 ENCOUNTER — Encounter (HOSPITAL_BASED_OUTPATIENT_CLINIC_OR_DEPARTMENT_OTHER): Payer: Self-pay | Admitting: Emergency Medicine

## 2024-06-25 ENCOUNTER — Other Ambulatory Visit: Payer: Self-pay

## 2024-06-25 ENCOUNTER — Emergency Department (HOSPITAL_BASED_OUTPATIENT_CLINIC_OR_DEPARTMENT_OTHER)
Admission: EM | Admit: 2024-06-25 | Discharge: 2024-06-25 | Disposition: A | Discharge status: Home / Self Care | Attending: Emergency Medicine | Admitting: Emergency Medicine

## 2024-06-25 DIAGNOSIS — T7840XA Allergy, unspecified, initial encounter: Secondary | ICD-10-CM

## 2024-06-25 DIAGNOSIS — L509 Urticaria, unspecified: Secondary | ICD-10-CM | POA: Diagnosis present

## 2024-06-25 MED ORDER — PREDNISONE 50 MG PO TABS
60.0000 mg | ORAL_TABLET | Freq: Once | ORAL | Status: AC
  Administered 2024-06-25: 60 mg via ORAL
  Filled 2024-06-25: qty 1

## 2024-06-25 MED ORDER — FAMOTIDINE 40 MG PO TABS: 40.0000 mg | ORAL_TABLET | Freq: Every day | ORAL | 0 refills | Status: DC

## 2024-06-25 MED ORDER — DIPHENHYDRAMINE HCL 25 MG PO CAPS
50.0000 mg | ORAL_CAPSULE | Freq: Once | ORAL | Status: AC
  Administered 2024-06-25: 50 mg via ORAL
  Filled 2024-06-25: qty 2

## 2024-06-25 MED ORDER — PREDNISONE 20 MG PO TABS: 40.0000 mg | ORAL_TABLET | Freq: Every day | ORAL | 0 refills | Status: DC

## 2024-06-25 MED ORDER — FAMOTIDINE 20 MG PO TABS
40.0000 mg | ORAL_TABLET | Freq: Once | ORAL | Status: AC
  Administered 2024-06-25: 40 mg via ORAL
  Filled 2024-06-25: qty 2

## 2024-06-25 NOTE — ED Provider Notes (Signed)
 [place] EMERGENCY DEPARTMENT AT [facility] Provider Note   CSN: [id number] Arrival date & time: 06/25/24  1522     Patient presents with: Urticaria   [name] is a 63 y.o. female.  Urticaria  Patient is a 63 year old female presenting to ED today for concerns for urticaria on bilateral upper extremities and abdomen that she noticed earlier this morning.  Notes that she has noticed them, took 2 Benadryl  and has had some relief of symptoms.  Reports that she is felt very similar to previous times when she has been in contact with shellfish.  Denies any contact with shellfish, denies any new medications, denies any new soaps or clothing changes or detergent changes, denies any fever, throat swelling, shortness of breath, facial swelling, chest pain, fatigue, vision changes.     Prior to Admission medications   Medication Sig Start Date End Date Taking? Authorizing Provider  famotidine  (PEPCID ) 40 MG tablet Take 1 tablet (40 mg total) by mouth daily. 06/25/24  Yes Simren Popson S, PA-C  predniSONE  (DELTASONE ) 20 MG tablet Take 2 tablets (40 mg total) by mouth daily for 5 days. 06/25/24 06/30/24 Yes Beola Terrall RAMAN, PA-C  albuterol (PROVENTIL HFA;VENTOLIN HFA) 108 (90 Base) MCG/ACT inhaler Inhale 2 puffs into the lungs every 6 (six) hours as needed for wheezing or shortness of breath.    [provider]  ALOE VERA JUICE PO Take by mouth as needed. 2 oz prn    [provider]  azelastine  (ASTELIN ) 0.1 % nasal spray Place 2 sprays into both nostrils 2 (two) times daily. Patient not taking: Reported on 06/19/2024 09/28/22   Luke Orlan HERO, DO  b complex vitamins capsule Take 1 capsule by mouth daily.    [provider]  cetirizine (ZYRTEC) 10 MG tablet Take 10 mg by mouth daily as needed.    [provider]  Cholecalciferol (VITAMIN D3) 125 MCG (5000 UT) CAPS Take 1 capsule by mouth daily.    [provider]  dicyclomine  (BENTYL ) 20 MG  tablet Take 1 tablet (20 mg total) by mouth every 6 (six) hours as needed (abdominal cramping). 04/24/23   Molpus, John, MD  Digestive Enzymes (DIGESTIVE ENZYME PO) Take by mouth as needed.    [provider]  EPINEPHrine 0.3 mg/0.3 mL IJ SOAJ injection See admin instructions. Patient taking differently: as needed. 10/02/19   [provider]  fluticasone  (FLONASE ) 50 MCG/ACT nasal spray Place 2 sprays into both nostrils daily. Patient taking differently: Place 2 sprays into both nostrils as needed. 09/28/22   Luke Orlan HERO, DO  fluticasone -salmeterol (ADVAIR) 100-50 MCG/ACT AEPB Inhale 1 puff into the lungs 2 (two) times daily. Rinse mouth after each use. Patient not taking: Reported on 06/19/2024 02/08/23   Luke Orlan HERO, DO  ibuprofen (ADVIL) 800 MG tablet Take 800 mg by mouth every 6 (six) hours as needed. for pain    [provider]  MAGNESIUM PO Take 120 mg by mouth daily.    [provider]  ondansetron  (ZOFRAN -ODT) 8 MG disintegrating tablet Take 1 tablet (8 mg total) by mouth every 8 (eight) hours as needed for nausea or vomiting. Patient not taking: Reported on 06/19/2024 04/24/23   Molpus, John, MD  pantoprazole  (PROTONIX ) 40 MG tablet Take 40 mg by mouth daily as needed. 10/16/21   [provider]  Spacer/Aero-Holding Chambers (AEROCHAMBER MV) inhaler Use as instructed Patient not taking: Reported on 06/19/2024 07/15/22   Luke Orlan HERO, DO  sucralfate  (CARAFATE )  1 g tablet Take 1 g by mouth 4 (four) times daily as needed. 10/16/21   [provider]    Allergies: Amoxicillin, Shellfish allergy , and Shellfish-derived products    Review of Systems  Skin:  Positive for rash.  All other systems reviewed and are negative.   Updated Vital Signs BP (!) 156/69 (BP Location: Right Arm)   Pulse 78   Temp 97.6 F (36.4 C)   Resp 18   SpO2 100%   Physical Exam Vitals and nursing note reviewed.  Constitutional:      General: She is not in acute  distress.    Appearance: Normal appearance. She is not ill-appearing or diaphoretic.  HENT:     Head: Normocephalic and atraumatic.  Eyes:     Extraocular Movements: Extraocular movements intact.     Conjunctiva/sclera: Conjunctivae normal.  Cardiovascular:     Rate and Rhythm: Normal rate and regular rhythm.     Pulses: Normal pulses.     Heart sounds: Normal heart sounds. No murmur heard.    No friction rub. No gallop.  Pulmonary:     Effort: Pulmonary effort is normal. No respiratory distress.     Breath sounds: Normal breath sounds. No stridor. No wheezing, rhonchi or rales.  Abdominal:     General: Abdomen is flat. There is no distension.     Palpations: Abdomen is soft.     Tenderness: There is no abdominal tenderness. There is no guarding.  Musculoskeletal:        General: No swelling or tenderness. Normal range of motion.     Cervical back: Normal range of motion and neck supple. No rigidity or tenderness.     Right lower leg: No edema.     Left lower leg: No edema.  Skin:    General: Skin is warm and dry.     Findings: Rash present. No bruising or lesion.     Comments: Diffuse urticaria noted to both upper extremities and across abdomen, no other rashes noted to back, chest, face, neck.  Noted to be pruritic and erythematous.  No mucous membrane involvement.  Neurological:     General: No focal deficit present.     Mental Status: She is alert and oriented to person, place, and time. Mental status is at baseline.     Sensory: No sensory deficit.     Motor: No weakness.     Gait: Gait normal.  Psychiatric:        Mood and Affect: Mood normal.     (all labs ordered are listed, but only abnormal results are displayed) Labs Reviewed - No data to display  EKG: None  Radiology: No results found.  Procedures   Medications Ordered in the ED  famotidine  (PEPCID ) tablet 40 mg (has no administration in time range)  predniSONE  (DELTASONE ) tablet 60 mg (60 mg Oral  Given 06/25/24 1722)  diphenhydrAMINE  (BENADRYL ) capsule 50 mg (50 mg Oral Given 06/25/24 1722)                                  Medical Decision Making Risk Prescription drug management.   This patient is a 63 year old female who presents to the ED for concern of allergic reaction and urticaria suspected to be likely from COVID that she had last night though and crossed [place] with shellfish, noting she has had previous reactions like this to shellfish in the past.  No new  soaps, detergents, foods, stings, or any other allergic source this time.  On physical exam, patient is in no acute distress, afebrile, alert and orient x 4, speaking in full sentences, nontachypneic, nontachycardic.  Oropharynx is clear, patient tolerating secretions and fluids.  No facial edema noted.  LCTAB, RRR, no murmur.  Diffuse urticaria noted across bilateral upper extremities as well as abdomen.  With none noted to face, neck.  Provided Benadryl  as well as prednisone  here.  On reevaluation, patient notes that the pruritus has decreased and urticaria have lessened in intensity however did note some few increasing spots on chest that she did not notice before.  Provide famotidine , with patient noting some improved symptoms.  Will send her home with a short dose of prednisone  as well as midodrine and have her continue take Benadryl  as needed.  Will recommend she follows up with allergist and PCP.  And also recommend she watch symptoms at home and return to the ED for any new or worsening symptoms.  Patient vital signs have remained stable throughout the course of patient's time in the ED. Low suspicion for any other emergent pathology at this time. I believe this patient is safe to be discharged. Provided strict return to ER precautions. Patient expressed agreement and understanding of plan. All questions were answered.  Differential diagnoses prior to evaluation: The emergent differential diagnosis includes, but is  not limited to, anaphylaxis, allergic reaction/urticaria,. This is not an exhaustive differential.   Past Medical History / Co-morbidities / Social History: HTN, small intestinal bacterial overgrowth, asthma, pneumonia, macular degeneration, GERD, IBS  Additional history: Chart reviewed. Pertinent results include: Known to have a positive ANA testing done on 06/19/2024   Medications: I ordered medication including prednisone , famotidine , Benadryl .  I have reviewed the patients home medicines and have made adjustments as needed.  Critical Interventions: None  Social Determinants of Health: None  Disposition: After consideration of the diagnostic results and the patients response to treatment, I feel that the patient would benefit from discharge and treatment as above.   emergency department workup does not suggest an emergent condition requiring admission or immediate intervention beyond what has been performed at this time. The plan is: Floating, prednisone , follow-up with PCP, follow-up with allergist, return to the ED for new or worsening symptoms. The patient is safe for discharge and has been instructed to return immediately for worsening symptoms, change in symptoms or any other concerns.    Final diagnoses:  Urticaria  Allergic reaction, initial encounter    ED Discharge Orders          Ordered    predniSONE  (DELTASONE ) 20 MG tablet  Daily        06/25/24 1818    famotidine  (PEPCID ) 40 MG tablet  Daily        06/25/24 1819               Beola Terrall RAMAN, PA-C 06/25/24 1823    Zackowski, Scott, MD 07/04/24 1036

## 2024-06-25 NOTE — ED Triage Notes (Signed)
 Red raised hives on arm trunk face and neck Notice today Itchy   Reports doing push ups yesterday and arms being swollen after  Denies know allergic contact

## 2024-06-25 NOTE — Discharge Instructions (Addendum)
 You were seen today for allergic reaction and urticaria.  Recommend you continue to use the prednisone  and famotidine  once daily for the next 5 days to help with the allergic reaction you had today.  Recommend you continue follow-up with PCP for continued evaluation as you may need to reevaluate seeing allergist.  Also recommend that you continue take Benadryl  as needed, be sure not to go above 300 mg in a day.,  Recommending that you take 50 mg 4 times a day as needed.

## 2024-06-25 NOTE — ED Notes (Signed)
 Reviewed AVS/discharge instruction with patient. Time allotted for and all questions answered. Patient is agreeable for d/c and escorted to ed exit by staff.

## 2024-06-26 DIAGNOSIS — L509 Urticaria, unspecified: Secondary | ICD-10-CM

## 2024-06-26 DIAGNOSIS — R768 Other specified abnormal immunological findings in serum: Secondary | ICD-10-CM

## 2024-06-27 ENCOUNTER — Other Ambulatory Visit: Payer: Self-pay

## 2024-06-27 ENCOUNTER — Encounter: Payer: Self-pay | Admitting: Allergy & Immunology

## 2024-06-27 ENCOUNTER — Ambulatory Visit (INDEPENDENT_AMBULATORY_CARE_PROVIDER_SITE_OTHER): Admitting: Allergy & Immunology

## 2024-06-27 VITALS — BP 130/100 | HR 75 | Temp 98.0°F | Resp 16 | Ht 65.55 in | Wt 243.7 lb

## 2024-06-27 DIAGNOSIS — J302 Other seasonal allergic rhinitis: Secondary | ICD-10-CM

## 2024-06-27 DIAGNOSIS — J3089 Other allergic rhinitis: Secondary | ICD-10-CM

## 2024-06-27 DIAGNOSIS — L509 Urticaria, unspecified: Secondary | ICD-10-CM | POA: Diagnosis not present

## 2024-06-27 DIAGNOSIS — K219 Gastro-esophageal reflux disease without esophagitis: Secondary | ICD-10-CM

## 2024-06-27 DIAGNOSIS — J4540 Moderate persistent asthma, uncomplicated: Secondary | ICD-10-CM

## 2024-06-27 DIAGNOSIS — T7800XD Anaphylactic reaction due to unspecified food, subsequent encounter: Secondary | ICD-10-CM

## 2024-06-27 DIAGNOSIS — H1010 Acute atopic conjunctivitis, unspecified eye: Secondary | ICD-10-CM

## 2024-06-27 MED ORDER — METHYLPREDNISOLONE ACETATE 80 MG/ML IJ SUSP
80.0000 mg | Freq: Once | INTRAMUSCULAR | Status: AC
  Administered 2024-06-27: 80 mg via INTRAMUSCULAR

## 2024-06-27 MED ORDER — FAMOTIDINE 40 MG PO TABS: 40.0000 mg | ORAL_TABLET | Freq: Two times a day (BID) | ORAL | 1 refills | Status: AC

## 2024-06-27 MED ORDER — PREDNISONE 10 MG PO TABS: ORAL_TABLET | ORAL | 0 refills | Status: DC

## 2024-06-27 NOTE — Patient Instructions (Addendum)
 1. Moderate persistent asthma without complication - Lung testing looks fairly good today. - I am fine with not continuing with the Advair since you have been stable without it.  - Daily controller medication(s): NOTHING - Prior to physical activity: albuterol 2 puffs 10-15 minutes before physical activity. - Rescue medications: albuterol 4 puffs every 4-6 hours as needed - Asthma control goals:  * Full participation in all desired activities (may need albuterol before activity) * Albuterol use two time or less a week on average (not counting use with activity) * Cough interfering with sleep two time or less a month * Oral steroids no more than once a year * No hospitalizations  2. Seasonal and perennial allergic rhinoconjunctivitis - Continue with the antihistamine daily for the hives. - Continue with Flonase  one spray per nostril daily. - This seems to be working well to control your environmental allergies. - We could consider doing allergy  shots for long term control.    3. 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 - Food sheet given for future prick testing for foods. - Circle the foods that you are interested in testing and we will test them at the next visit.   4. Urticaria - DepoMedrol injection given today. - We are going to start an extended prednisone  taper. - Continue with Allegra, but increase to TWICE DAILY..  - Continue with famotidine  TWICE DAILY. - Consider the adding of Xolair for management of the hives as well as your food allergies. - Handout provided today.   6. Return in about 6 weeks (around 08/08/2024). You can have the follow up appointment with Dr. Iva or a Nurse Practicioner (our Nurse Practitioners are excellent and always have Physician oversight!).    Please inform us  of any Emergency Department visits, hospitalizations, or changes in symptoms. Call us  before going to the ED for breathing or allergy   symptoms since we might be able to fit you in for a sick visit. Feel free to contact us  anytime with any questions, problems, or concerns.  It was a pleasure to see you again today!  Websites that have reliable patient information: 1. American Academy of Asthma, Allergy , and Immunology: www.aaaai.org 2. Food Allergy  Research and Education (FARE): foodallergy.org 3. Mothers of Asthmatics: http://www.asthmacommunitynetwork.org 4. American College of Allergy , Asthma, and Immunology: www.acaai.org      "Like" us  on Facebook and Instagram for our latest updates!      A healthy democracy works best when [employer] participate! Make sure you are registered to vote! If you have moved or changed any of your contact information, you will need to get this updated before voting! Scan the QR codes below to learn more!

## 2024-06-27 NOTE — Progress Notes (Addendum)
 FOLLOW UP  Date of Service/Encounter:  06/27/24   Assessment:   Moderate persistent asthma without complication  Seasonal and perennial allergic rhinoconjunctivitis  Anaphylactic reaction due to food, subsequent encounter  Gastroesophageal reflux disease  Urticaria - with elevated ANA (RNP antibody positive)  Plan/Recommendations:   1. Moderate persistent asthma without complication - Lung testing looks fairly good today. - I am fine with not continuing with the Advair since you have been stable without it.  - Daily controller medication(s): NOTHING - Prior to physical activity: albuterol 2 puffs 10-15 minutes before physical activity. - Rescue medications: albuterol 4 puffs every 4-6 hours as needed - Asthma control goals:  * Full participation in all desired activities (may need albuterol before activity) * Albuterol use two time or less a week on average (not counting use with activity) * Cough interfering with sleep two time or less a month * Oral steroids no more than once a year * No hospitalizations  2. Seasonal and perennial allergic rhinoconjunctivitis - Continue with the antihistamine daily for the hives. - Continue with Flonase  one spray per nostril daily. - This seems to be working well to control your environmental allergies. - We could consider doing allergy  shots for long term control.    3. 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 - Food sheet given for future prick testing for foods. - Circle the foods that you are interested in testing and we will test them at the next visit.   4. Urticaria - DepoMedrol injection given today. - We are going to start an extended prednisone  taper. - Continue with Allegra, but increase to TWICE DAILY..  - Continue with famotidine  TWICE DAILY. - Consider the adding of Xolair for management of the hives as well as your food allergies. - Handout provided today.   6.  Return in about 6 weeks (around 08/08/2024). You can have the follow up appointment with Dr. Iva or a Nurse Practicioner (our Nurse Practitioners are excellent and always have Physician oversight!).    Subjective:   [name] is a 63 y.o. female presenting today for follow up of  Chief Complaint  Patient presents with   Urticaria    Flared up since on Saturday.     [name] has a history of the following: Patient Active Problem List   Diagnosis Date Noted   Seasonal and perennial allergic rhinoconjunctivitis 09/28/2022   Anaphylactic reaction due to food, subsequent encounter 09/28/2022   Anxiety 07/06/2022   Other allergy  status, other than to drugs and biological substances 07/06/2022   Paresthesia of skin 07/06/2022   Pure hypercholesterolemia 07/06/2022   Shellfish allergy  07/06/2022   Ventricular premature depolarization 07/06/2022   Urticaria 07/06/2022   Moderate persistent asthma 07/06/2022   Pruritus 07/06/2022   Gastroesophageal reflux disease 01/31/2021   Chronic RUQ pain 09/17/2020   Small intestinal bacterial overgrowth 08/08/2019   Excessive daytime sleepiness 02/06/2019   Loud snoring 02/06/2019   Class 2 severe obesity with serious comorbidity and body mass index (BMI) of 38.0 to 38.9 in adult [facility]) 01/10/2019   Vitamin D  deficiency 01/10/2019   Heart palpitations 11/11/2016   Chest pain 11/11/2016   Leg pain 11/11/2016   Essential hypertension 03/03/2014    History obtained from: chart review and patient.  Discussed the use of AI scribe software for clinical note transcription with the patient and/or guardian, who gave verbal consent to proceed.  [name] is a 63  y.o. female presenting for a sick visit. She was last seen in May 2024 by Dr. Luke.  At that time, she was started on Advair 100 mcg 1 puff twice daily as well as albuterol as needed.  For her rhinitis, she was continued on an over-the-counter antihistamine as well as Flonase  and  nasal saline.  For her food allergies, she continue to avoid shellfish as well as chocolate, broccoli, and red meat.  The only anaphylactic reaction was to shellfish.  EpiPen was refilled.  For her GERD, she was continued on her lifestyle and dietary modifications.  Since last visit, she has largely done well.  She experienced a sudden onset of hives all over her body starting on Saturday, which led to an emergency room visit on Sunday.  She was placed on prednisone  as well as as Allegra and famotidine .  She was prescribed prednisone  and has been taking it since, but experienced another outbreak this morning. She has now added Allegra Hive, a 24-hour antihistamine, to her regimen, and the hives are beginning to subside.  Before this most recent onset, she was actually hive free for over a year.  She does see rheumatology.  Her last ANA was negative, although the RNP was still positive.  They are not treating her for anything in particular. Her last appointment was in July 2025.   She has a known shellfish allergy  and suspects that a collagen supplement she started a month ago might be related to her symptoms. The supplement is labeled as possibly manufactured in a facility with shellfish, although it is derived from bovine sources. She has not had hives for a year prior to this recent episode.  Asthma/Respiratory Symptom History: Her asthma is managed with albuterol as needed, particularly with temperature changes, and she does not experience nighttime coughing. She previously used Advair for a month or two.   Allergic Rhinitis Symptom History: Allergy  testing in the past revealed high sensitivity to shellfish, dust mites, and cockroaches.   GERD Symptom History: She avoids chocolate due to acid reflux and does not consume caffeine. No new exposures or changes in her environment have been noted.  Otherwise, there have been no changes to her past medical history, surgical history, family history, or  social history.    Review of systems otherwise negative other than that mentioned in the HPI.    Objective:   Blood pressure (!) 130/100, pulse 75, temperature 98 F (36.7 C), temperature source Temporal, resp. rate 16, height 5' 5.55 (1.665 m), weight 243 lb 11.2 oz (110.5 kg), SpO2 97%. Body mass index is 39.87 kg/m.    Physical Exam Vitals reviewed.  Constitutional:      Appearance: She is well-developed.  HENT:     Head: Normocephalic and atraumatic.     Right Ear: Tympanic membrane, ear canal and external ear normal.     Left Ear: Tympanic membrane, ear canal and external ear normal.     Nose: No nasal deformity, septal deviation, mucosal edema or rhinorrhea.     Right Turbinates: Enlarged, swollen and pale.     Left Turbinates: Enlarged, swollen and pale.     Right Sinus: No maxillary sinus tenderness or frontal sinus tenderness.     Left Sinus: No maxillary sinus tenderness or frontal sinus tenderness.     Mouth/Throat:     Mouth: Mucous membranes are not pale and not dry.     Pharynx: Uvula midline.  Eyes:     General: Lids are normal. No  allergic shiner.       Right eye: No discharge.        Left eye: No discharge.     Conjunctiva/sclera: Conjunctivae normal.     Right eye: Right conjunctiva is not injected. No chemosis.    Left eye: Left conjunctiva is not injected. No chemosis.    Pupils: Pupils are equal, round, and reactive to light.  Cardiovascular:     Rate and Rhythm: Normal rate and regular rhythm.     Heart sounds: Normal heart sounds.  Pulmonary:     Effort: Pulmonary effort is normal. No tachypnea, accessory muscle usage or respiratory distress.     Breath sounds: Normal breath sounds. No wheezing, rhonchi or rales.  Chest:     Chest wall: No tenderness.  Lymphadenopathy:     Cervical: No cervical adenopathy.  Skin:    Coloration: Skin is not pale.     Findings: Rash present. No abrasion, erythema or petechiae. Rash is urticarial. Rash is not  papular or vesicular.     Comments: Resolving urticaria on the bilateral arms as well as bilateral legs.  Neurological:     Mental Status: She is alert.  Psychiatric:        Behavior: Behavior is cooperative.      Diagnostic studies:    Spirometry: results abnormal (FEV1: 1.46/65%, FVC: 2.21/77%, FEV1/FVC: 66%).    Spirometry consistent with mild obstructive disease.   Allergy  Studies: labs sent instead       Marty Shaggy, MD  [facility] 

## 2024-06-27 NOTE — Addendum Note (Signed)
 Addended by: IVA MARTY SALTNESS on: 06/27/2024 04:42 PM   Modules accepted: Orders

## 2024-06-28 NOTE — Telephone Encounter (Signed)
 Ok to place referral to allergist for further evaluation.    Please also pend future orders to repeat labs in 6 months--ANA, RNP, complements, Smith antibody, dsDNA, Ro, La, Smith, ESR, CRP, CK, UA, CBC, and CMP

## 2024-06-30 LAB — ALPHA-GAL PANEL
Allergen Lamb IgE: <0.1 kU/L
Beef IgE: <0.1 kU/L
IgE (Immunoglobulin E), Serum: 336 [IU]/mL (ref 6–495)
O215-IgE Alpha-Gal: <0.1 kU/L
Pork IgE: <0.1 kU/L

## 2024-06-30 LAB — C074-IGE GELATIN: C074-IgE Gelatin: <0.1 kU/L

## 2024-07-04 ENCOUNTER — Ambulatory Visit: Payer: Self-pay | Admitting: Allergy & Immunology

## 2024-08-07 ENCOUNTER — Ambulatory Visit: Admitting: Allergy

## 2024-08-07 ENCOUNTER — Encounter: Payer: Self-pay | Admitting: Allergy

## 2024-08-07 ENCOUNTER — Ambulatory Visit (INDEPENDENT_AMBULATORY_CARE_PROVIDER_SITE_OTHER): Admitting: Allergy

## 2024-08-07 DIAGNOSIS — J302 Other seasonal allergic rhinitis: Secondary | ICD-10-CM | POA: Diagnosis not present

## 2024-08-07 DIAGNOSIS — T781XXD Other adverse food reactions, not elsewhere classified, subsequent encounter: Secondary | ICD-10-CM

## 2024-08-07 DIAGNOSIS — J4520 Mild intermittent asthma, uncomplicated: Secondary | ICD-10-CM

## 2024-08-07 DIAGNOSIS — J3089 Other allergic rhinitis: Secondary | ICD-10-CM

## 2024-08-07 DIAGNOSIS — H1010 Acute atopic conjunctivitis, unspecified eye: Secondary | ICD-10-CM

## 2024-08-07 DIAGNOSIS — L509 Urticaria, unspecified: Secondary | ICD-10-CM | POA: Diagnosis not present

## 2024-08-07 NOTE — Patient Instructions (Addendum)
 Today's skin testing only positive to shellfish. Negative to other select foods.   Results given.  Food allergies Continue strict avoidance of seafood.  For mild symptoms you can take over the counter antihistamines (zyrtec 10mg  to 20mg ) and monitor symptoms closely.  If symptoms worsen or if you have severe symptoms including breathing issues, throat closure, significant swelling, whole body hives, severe diarrhea and vomiting, lightheadedness then use epinephrine and seek immediate medical care afterwards. Emergency action plan in place.   Regarding the histamine intolerance I don't do any histamine intolerance testing. The only thing I check regarding this is a tryptase level and a chronic urticaria level - this can be helping to see if someone is making excess histamine.  This was normal in 2023.  You can consider going to [facility] to explore this further.   Regarding the chemicals We offer patch testing - see handout on what's on the patch testing. Patches are best placed on Monday with return to office on Wednesday and Friday of same week for readings.  Patches once placed should not get wet.  You do not have to stop any medications for patch testing but should not be on oral prednisone . You can schedule a patch testing visit when convenient for your schedule.    Asthma May use albuterol rescue inhaler 2 puffs every 4 to 6 hours as needed for shortness of breath, chest tightness, coughing, and wheezing. May use albuterol rescue inhaler 2 puffs 5 to 15 minutes prior to strenuous physical activities. Monitor frequency of use - if you need to use it more than twice per week on a consistent basis let us  know.   Environmental allergies 2023 skin prick testing showed: Positive to weed pollen. 2023 bloodwork positive to dust mites, grass, cockroach, trees, ragweed. Continue environmental control measures.  Use over the counter antihistamines such as Claritin   (loratadine ), Allegra (fexofenadine), or Xyzal (levocetirizine) daily as needed. May take twice a day during allergy  flares. May switch antihistamines every few months. Use Flonase  (fluticasone ) nasal spray 1-2 sprays per nostril once a day as needed for nasal congestion.  Nasal saline spray (i.e., Simply Saline) or nasal saline lavage (i.e., NeilMed) is recommended as needed and prior to medicated nasal sprays. Consider allergy  injections for long term control if above medications do not help the symptoms - handout given.   Follow up in 4 months or sooner if needed.  Control of House Dust Mite Allergen Dust mite allergens are a common trigger of allergy  and asthma symptoms. While they can be found throughout the house, these microscopic creatures thrive in warm, humid environments such as bedding, upholstered furniture and carpeting. Because so much time is spent in the bedroom, it is essential to reduce mite levels there.  Encase pillows, mattresses, and box springs in special allergen-proof fabric covers or airtight, zippered plastic covers.  Bedding should be washed weekly in hot water (130 F) and dried in a hot dryer. Allergen-proof covers are available for comforters and pillows that can't be regularly washed.  Wash the allergy -proof covers every few months. Minimize clutter in the bedroom. Keep pets out of the bedroom.  Keep humidity less than 50% by using a dehumidifier or air conditioning. You can buy a humidity measuring device called a hygrometer to monitor this.  If possible, replace carpets with hardwood, linoleum, or washable area rugs. If that's not possible, vacuum frequently with a vacuum that has a HEPA filter. Remove all upholstered furniture and non-washable window drapes from  the bedroom. Remove all non-washable stuffed toys from the bedroom.  Wash stuffed toys weekly.  Reducing Pollen Exposure Pollen seasons: trees (spring), grass (summer) and ragweed/weeds (fall). Keep  windows closed in your home and car to lower pollen exposure.  Install air conditioning in the bedroom and throughout the house if possible.  Avoid going out in dry windy days - especially early morning. Pollen counts are highest between 5 - 10 AM and on dry, hot and windy days.  Save outside activities for late afternoon or after a heavy rain, when pollen levels are lower.  Avoid mowing of grass if you have grass pollen allergy . Be aware that pollen can also be transported indoors on people and pets.  Dry your clothes in an automatic dryer rather than hanging them outside where they might collect pollen.  Rinse hair and eyes before bedtime.  Cockroach Allergen Avoidance Cockroaches are often found in the homes of densely populated urban areas, schools or commercial buildings, but these creatures can lurk almost anywhere. This does not mean that you have a dirty house or living area. Block all areas where roaches can enter the home. This includes crevices, wall cracks and windows.  Cockroaches need water to survive, so fix and seal all leaky faucets and pipes. Have an exterminator go through the house when your family and pets are gone to eliminate any remaining roaches. Keep food in lidded containers and put pet food dishes away after your pets are done eating. Vacuum and sweep the floor after meals, and take out garbage and recyclables. Use lidded garbage containers in the kitchen. Wash dishes immediately after use and clean under stoves, refrigerators or toasters where crumbs can accumulate. Wipe off the stove and other kitchen surfaces and cupboards regularly.

## 2024-08-07 NOTE — Progress Notes (Signed)
 Skin testing note  RE: [name] MRN: [id number] DOB: 05/18/1961 Date of Office Visit: 08/07/2024  Referring provider: Regino Slater, MD Primary care provider: Regino Slater, MD  Chief Complaint: skin testing  History of Present Illness: I had the pleasure of seeing [name] for a skin testing visit at the [facility] on 08/07/2024. She is a 63 y.o. female, who is being followed for asthma, allergic rhino conjunctivitis, food allergies, hives. Her previous allergy  office visit was on 06/27/2024 with Dr. Iva. Today is a skin testing visit but has multiple questions.   Discussed the use of AI scribe software for clinical note transcription with the patient, who gave verbal consent to proceed.    She experienced a breakout of hives previously, prompting the referral for allergy  testing. No recent hives have occurred since the initial episode, and the cause remains unknown. She has been able to stop her allergy  medications since then.  She has a history of avoiding certain foods due to concerns about cross-contamination and known allergies. She avoids seafood, particularly shellfish, but was previously consuming salmon and trout. Recently, she has also been avoiding corn.  She has environmental allergies and asthma, contributing to symptoms such as a stuffy nose and occasional wheezing. She previously underwent environmental allergy  testing two years ago. She has stopped using her inhaler, Advair, and does not feel that her symptoms have worsened.  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   She reports persistent symptoms of itching and burning eyes.     Assessment and Plan: [name] is a 63 y.o. female with: Other adverse food reactions, not elsewhere classified, subsequent encounter Past history - Shellfish caused hives and trouble breathing in the past. Avoiding chocolate, broccoli  and red meat due to worsening acid reflux. 2023 skin prick testing was negative to select foods but positive control was borderline. 2023 bloodwork was positive to shellfish and mollusks. Interim history - patient broke out and concerned about new allergies Today's skin testing only positive to shellfish. Negative to other select foods.  Continue strict avoidance of seafood.  For mild symptoms you can take over the counter antihistamines (zyrtec 10mg  to 20mg ) and monitor symptoms closely.  If symptoms worsen or if you have severe symptoms including breathing issues, throat closure, significant swelling, whole body hives, severe diarrhea and vomiting, lightheadedness then use epinephrine and seek immediate medical care afterwards. Emergency action plan in place.   Urticaria Past history - 2023 bloodwork positive ANA with elevated RNP antibody, normal tryptase and CU index. Saw rheumatology in the past.  Interim history - patient concerned bout histamine intolerance and chemical testing.  Discussed with patient that we don't do histamine intolerance testing.  The only thing I check regarding this is a tryptase level and a chronic urticaria level which was normal in 2023. Offered to reorder the labs - patient declined.  You can consider going to [facility] to explore this further.   Regarding the chemicals We do offer patch testing - see handout on what's on the patch testing.  Mild intermittent asthma without complication Past history - Coughing, wheezing for 5+ yrs. Triggers include weather change. Using albuterol on rare occasions. Covid-19 in 2023 and 2022. H/o GERD. 2023 spirometry showed moderate obstruction with 31% improvement in FEV1 post bronchodilator treatment. Clinically feeling unchanged. 2023 CXR normal.  Interim history - No flare in symptoms with coming off maintenance  inhaler.  May use albuterol rescue inhaler 2 puffs every 4 to 6 hours as needed for shortness  of breath, chest tightness, coughing, and wheezing. May use albuterol rescue inhaler 2 puffs 5 to 15 minutes prior to strenuous physical activities. Monitor frequency of use - if you need to use it more than twice per week on a consistent basis let us  know.   Seasonal and perennial allergic rhinoconjunctivitis Past history - Perennial rhinoconjunctivitis symptoms for 10 years.  Uses Zyrtec and Flonase  with some benefit.  No prior ENT evaluation. 2023 skin prick testing positive to weed pollen. 2023 bloodwork positive to dust mites, grass, cockroach, trees, ragweed. Continue environmental control measures.  Use over the counter antihistamines such as Claritin  (loratadine ), Allegra (fexofenadine), or Xyzal (levocetirizine) daily as needed. May take twice a day during allergy  flares. May switch antihistamines every few months. Use Flonase  (fluticasone ) nasal spray 1-2 sprays per nostril once a day as needed for nasal congestion.  Nasal saline spray (i.e., Simply Saline) or nasal saline lavage (i.e., NeilMed) is recommended as needed and prior to medicated nasal sprays. Consider allergy  injections for long term control if above medications do not help the symptoms - handout given.   Return in about 4 months (around 12/07/2024).  No orders of the defined types were placed in this encounter.  Lab Orders  No laboratory test(s) ordered today    Diagnostics: Skin Testing: Select foods. Today's skin testing only positive to shellfish. Negative to other select foods.  Results discussed with patient/family.  Food Adult Perc - 08/07/24 1100     Time Antigen Placed 1117    Allergen Manufacturer Jestine    Location Back    Number of allergen test 35     Control-buffer 50% Glycerol Negative    Control-Histamine 2+    1. Peanut Negative    2. Soybean Negative    3. Wheat Negative    4. Sesame Negative    5. Milk, Cow Negative    6. Casein Negative    7. Egg White, Chicken Negative    8. Shellfish  Mix --   15x10   9. Fish Mix Negative    10. Cashew Negative    11. Walnut Food Negative    17. Coconut Negative    18. Trout Negative    19. Tuna Negative    20. Salmon Negative    21. Flounder Negative    29. Rice Negative    33. [place] Meat Negative    34. Chicken Meat Negative    38. Tomato Negative    40. Sweet Potato Negative    45. Green Pepper Negative    47. Onion Negative    48. Avocado Negative    49. Cabbage Negative    52. Corn Negative    56. Lemon Negative    57. Banana Negative    58. Apple Negative    61. Blueberry Negative    65. Pineapple Negative    66. Chocolate/Cacao Bean Negative    69. Ginger Negative          Previous notes and tests were reviewed. The plan was reviewed with the patient/family, and all questions/concerned were addressed.  It was my pleasure to see [name] today and participate in her care. Please feel free to contact me with any questions or concerns.  Sincerely,  Orlan Cramp, DO Allergy  & Immunology  [facility]   [place] office: [phone] [facility] office: [phone]

## 2024-10-06 ENCOUNTER — Emergency Department (HOSPITAL_BASED_OUTPATIENT_CLINIC_OR_DEPARTMENT_OTHER)
Admission: EM | Admit: 2024-10-06 | Discharge: 2024-10-06 | Disposition: A | Discharge status: Home / Self Care | Attending: Emergency Medicine | Admitting: Emergency Medicine

## 2024-10-06 ENCOUNTER — Other Ambulatory Visit: Payer: Self-pay

## 2024-10-06 ENCOUNTER — Emergency Department (HOSPITAL_BASED_OUTPATIENT_CLINIC_OR_DEPARTMENT_OTHER)

## 2024-10-06 DIAGNOSIS — R030 Elevated blood-pressure reading, without diagnosis of hypertension: Secondary | ICD-10-CM

## 2024-10-06 DIAGNOSIS — R519 Headache, unspecified: Secondary | ICD-10-CM

## 2024-10-06 DIAGNOSIS — Z79899 Other long term (current) drug therapy: Secondary | ICD-10-CM | POA: Insufficient documentation

## 2024-10-06 DIAGNOSIS — I10 Essential (primary) hypertension: Secondary | ICD-10-CM | POA: Insufficient documentation

## 2024-10-06 LAB — BASIC METABOLIC PANEL WITH GFR
Anion gap: 8 (ref 5–15)
BUN: 11 mg/dL (ref 8–23)
CO2: 30 mmol/L (ref 22–32)
Calcium: 9.2 mg/dL (ref 8.9–10.3)
Chloride: 104 mmol/L (ref 98–111)
Creatinine, Ser: 0.74 mg/dL (ref 0.44–1.00)
GFR, Estimated: >60 mL/min (ref >60)
Glucose, Bld: 112 mg/dL — ABNORMAL HIGH (ref 70–99)
Potassium: 4.1 mmol/L (ref 3.5–5.1)
Sodium: 142 mmol/L (ref 135–145)

## 2024-10-06 LAB — CBC WITH DIFFERENTIAL/PLATELET
Abs Immature Granulocytes: 0.01 K/uL (ref 0.00–0.07)
Basophils Absolute: 0 K/uL (ref 0.0–0.1)
Basophils Relative: 0 %
Eosinophils Absolute: 0.1 K/uL (ref 0.0–0.5)
Eosinophils Relative: 2 %
HCT: 38.9 % (ref 36.0–46.0)
Hemoglobin: 13 g/dL (ref 12.0–15.0)
Immature Granulocytes: 0 %
Lymphocytes Relative: 33 %
Lymphs Abs: 2 K/uL (ref 0.7–4.0)
MCH: 31.2 pg (ref 26.0–34.0)
MCHC: 33.4 g/dL (ref 30.0–36.0)
MCV: 93.3 fL (ref 80.0–100.0)
Monocytes Absolute: 0.4 K/uL (ref 0.1–1.0)
Monocytes Relative: 7 %
Neutro Abs: 3.5 K/uL (ref 1.7–7.7)
Neutrophils Relative %: 58 %
Platelets: 236 K/uL (ref 150–400)
RBC: 4.17 MIL/uL (ref 3.87–5.11)
RDW: 12.5 % (ref 11.5–15.5)
WBC: 6 K/uL (ref 4.0–10.5)
nRBC: 0 % (ref 0.0–0.2)

## 2024-10-06 MED ORDER — ACETAMINOPHEN 500 MG PO TABS
1000.0000 mg | ORAL_TABLET | Freq: Once | ORAL | Status: AC
  Administered 2024-10-06: 1000 mg via ORAL
  Filled 2024-10-06: qty 2

## 2024-10-06 MED ORDER — AMLODIPINE BESYLATE 5 MG PO TABS: 5.0000 mg | ORAL_TABLET | Freq: Every day | ORAL | 0 refills | Status: AC

## 2024-10-06 MED ORDER — AMLODIPINE BESYLATE 5 MG PO TABS
5.0000 mg | ORAL_TABLET | Freq: Once | ORAL | Status: AC
  Administered 2024-10-06: 5 mg via ORAL
  Filled 2024-10-06: qty 1

## 2024-10-06 NOTE — ED Notes (Signed)
 Dc instructions given, pt medicated per emar for hypertension. Verbalized understanding. Out of ED with steady gait, not in visible distress.

## 2024-10-06 NOTE — ED Notes (Signed)
 Medicated per emar for HA, denies blurred vision/dizziness. Blood drawn and walked to lab. Pt to CT via WC. EKG to be obtained when pt returns from CT.

## 2024-10-06 NOTE — ED Provider Notes (Signed)
  EMERGENCY DEPARTMENT AT [facility]  Provider Note  CSN: [id number] Arrival date & time: 10/06/24 0141  History Chief Complaint  Patient presents with   Hypertension   Headache    [name] is a 63 y.o. female with prior history of HTN has not required medication for about a year (was previously on propranolol) reports intermittent headaches over the last several days, woke up around midnight tonight with a frontal headache and checked her BP at home and noted it was elevated. She had an elevated BP at a doctor's visit earlier in the day as well but otherwise does not check it regularly. No blurry vision, numbness, weakness, CP, SOB or vomiting.    Home Medications Prior to Admission medications   Medication Sig Start Date End Date Taking? Authorizing Provider  amLODipine (NORVASC) 5 MG tablet Take 1 tablet (5 mg total) by mouth daily. 10/06/24  Yes Roselyn Carlin NOVAK, MD  albuterol (PROVENTIL HFA;VENTOLIN HFA) 108 (90 Base) MCG/ACT inhaler Inhale 2 puffs into the lungs every 6 (six) hours as needed for wheezing or shortness of breath.    [provider]  ALOE VERA JUICE PO Take by mouth as needed. 2 oz prn    [provider]  b complex vitamins capsule Take 1 capsule by mouth daily.    [provider]  cetirizine (ZYRTEC) 10 MG tablet Take 10 mg by mouth daily as needed.    [provider]  Cholecalciferol (VITAMIN D3) 125 MCG (5000 UT) CAPS Take 1 capsule by mouth daily.    [provider]  Digestive Enzymes (DIGESTIVE ENZYME PO) Take by mouth as needed.    [provider]  famotidine  (PEPCID ) 40 MG tablet Take 1 tablet (40 mg total) by mouth 2 (two) times daily. 06/27/24   Iva Marty Saltness, MD  fluticasone  (FLONASE ) 50 MCG/ACT nasal spray Place 2 sprays into both nostrils daily. Patient not taking: Reported on 06/27/2024 09/28/22   Luke Orlan HERO, DO  ibuprofen (ADVIL) 800 MG tablet Take 800 mg by mouth  every 6 (six) hours as needed. for pain    [provider]  MAGNESIUM PO Take 120 mg by mouth daily.    [provider]  sucralfate  (CARAFATE ) 1 g tablet Take 1 g by mouth 4 (four) times daily as needed. 10/16/21   [provider]     Allergies    Amoxicillin, Shellfish allergy , and Shellfish protein-containing drug products   Review of Systems   Review of Systems Please see HPI for pertinent positives and negatives  Physical Exam BP (!) 168/69   Pulse 70   Temp 98.2 F (36.8 C) (Oral)   Resp 17   Ht 5' 6 (1.676 m)   Wt 107.5 kg   SpO2 100%   BMI 38.25 kg/m   Physical Exam Vitals and nursing note reviewed.  Constitutional:      Appearance: Normal appearance.  HENT:     Head: Normocephalic and atraumatic.     Nose: Nose normal.     Mouth/Throat:     Mouth: Mucous membranes are moist.  Eyes:     Extraocular Movements: Extraocular movements intact.     Conjunctiva/sclera: Conjunctivae normal.  Cardiovascular:     Rate and Rhythm: Normal rate.  Pulmonary:     Effort: Pulmonary effort is normal.     Breath sounds: Normal breath sounds.  Abdominal:     General: Abdomen is flat.     Palpations: Abdomen is  soft.     Tenderness: There is no abdominal tenderness.  Musculoskeletal:        General: No swelling. Normal range of motion.     Cervical back: Neck supple.  Skin:    General: Skin is warm and dry.  Neurological:     General: No focal deficit present.     Mental Status: She is alert.  Psychiatric:        Mood and Affect: Mood normal.     ED Results / Procedures / Treatments   EKG EKG Interpretation Date/Time:  Friday October 06 2024 02:27:42 EST Ventricular Rate:  61 PR Interval:  169 QRS Duration:  92 QT Interval:  490 QTC Calculation: 494 R Axis:   41  Text Interpretation: Sinus rhythm Low voltage, precordial leads Borderline T abnormalities, anterior leads Borderline prolonged QT interval No significant change since  last tracing Confirmed by Roselyn Dunnings [id number]) on 10/06/2024 2:36:53 AM  Procedures Procedures  Medications Ordered in the ED Medications  amLODipine (NORVASC) tablet 5 mg (has no administration in time range)  acetaminophen  (TYLENOL ) tablet 1,000 mg (1,000 mg Oral Given 10/06/24 0208)    Initial Impression and Plan  Patient here with elevated and headache which is unusual for her. Will check labs, EKG and CT to eval signs of organ damage. Anticipate restarting antihypertensives with outpatient PCP follow up if ED workup is neg.   ED Course   Clinical Course as of 10/06/24 0250  Fri Oct 06, 2024  0220 CBC is normal.  [CS]  0236 BMP is normal.  [CS]  0239 I personally viewed the images from radiology studies and agree with radiologist interpretation: CT neg for acute process [CS]  0248 Patient resting comfortably, no signs of end organ damage in the ED. Will start amlodipine 5mg , recommend she monitor BP at home, keep a log and PCP follow up, RTED for any other concerns.   [CS]    Clinical Course User Index [CS] Roselyn Dunnings NOVAK, MD     MDM Rules/Calculators/A&P Medical Decision Making Problems Addressed: Acute nonintractable headache, unspecified headache type: acute illness or injury Elevated blood pressure reading: chronic illness or injury with exacerbation, progression, or side effects of treatment  Amount and/or Complexity of Data Reviewed Labs: ordered. Decision-making details documented in ED Course. Radiology: ordered and independent interpretation performed. Decision-making details documented in ED Course. ECG/medicine tests: ordered and independent interpretation performed. Decision-making details documented in ED Course.  Risk OTC drugs. Prescription drug management.     Final Clinical Impression(s) / ED Diagnoses Final diagnoses:  Elevated blood pressure reading  Acute nonintractable headache, unspecified headache type    Rx / DC Orders ED  Discharge Orders          Ordered    amLODipine (NORVASC) 5 MG tablet  Daily        10/06/24 0250             Roselyn Dunnings NOVAK, MD 10/06/24 [phone]

## 2024-10-06 NOTE — ED Triage Notes (Signed)
 Pt POV reporting headache that woke her up from sleep, BP 170 sys at home, not currently on BP meds.

## 2024-10-06 NOTE — ED Notes (Signed)
 ED Provider at bedside.

## 2024-10-06 NOTE — ED Notes (Signed)
Pt dispo home

## 2024-10-06 NOTE — ED Notes (Signed)
 Pt returned from CT, tolerated well. Placed on heart monitor, EKG obtained. EKG given to Dr Roselyn. Awaiting CT results.

## 2024-12-11 ENCOUNTER — Ambulatory Visit: Admitting: Allergy

## 2025-06-18 ENCOUNTER — Ambulatory Visit: Admitting: Rheumatology
